# Patient Record
Sex: Male | Born: 1945 | Race: Black or African American | Hispanic: No | Marital: Married | Smoking: Former smoker
Health system: Southern US, Community
[De-identification: ages and names within clinical notes are randomized; demographics above are authoritative.]

## PROBLEM LIST (undated history)

## (undated) DIAGNOSIS — F4310 Post-traumatic stress disorder, unspecified: Secondary | ICD-10-CM

## (undated) DIAGNOSIS — I10 Essential (primary) hypertension: Secondary | ICD-10-CM

## (undated) DIAGNOSIS — R7303 Prediabetes: Secondary | ICD-10-CM

## (undated) DIAGNOSIS — K700 Alcoholic fatty liver: Secondary | ICD-10-CM

## (undated) DIAGNOSIS — K219 Gastro-esophageal reflux disease without esophagitis: Secondary | ICD-10-CM

## (undated) DIAGNOSIS — N289 Disorder of kidney and ureter, unspecified: Secondary | ICD-10-CM

## (undated) DIAGNOSIS — I429 Cardiomyopathy, unspecified: Secondary | ICD-10-CM

## (undated) DIAGNOSIS — F419 Anxiety disorder, unspecified: Secondary | ICD-10-CM

## (undated) DIAGNOSIS — F1020 Alcohol dependence, uncomplicated: Secondary | ICD-10-CM

## (undated) DIAGNOSIS — G4730 Sleep apnea, unspecified: Secondary | ICD-10-CM

## (undated) DIAGNOSIS — I519 Heart disease, unspecified: Secondary | ICD-10-CM

## (undated) HISTORY — DX: Alcoholic fatty liver: K70.0

## (undated) HISTORY — DX: Sleep apnea, unspecified: G47.30

## (undated) HISTORY — DX: Prediabetes: R73.03

## (undated) HISTORY — DX: Post-traumatic stress disorder, unspecified: F43.10

## (undated) HISTORY — DX: Alcohol dependence, uncomplicated: F10.20

## (undated) HISTORY — DX: Cardiomyopathy, unspecified: I42.9

## (undated) HISTORY — DX: Anxiety disorder, unspecified: F41.9

---

## 1999-03-10 ENCOUNTER — Emergency Department (HOSPITAL_COMMUNITY): Admission: EM | Admit: 1999-03-10 | Discharge: 1999-03-10 | Payer: Self-pay

## 2004-01-04 ENCOUNTER — Emergency Department (HOSPITAL_COMMUNITY): Admission: EM | Admit: 2004-01-04 | Discharge: 2004-01-04 | Payer: Self-pay | Admitting: Emergency Medicine

## 2004-03-29 ENCOUNTER — Ambulatory Visit: Payer: Self-pay | Admitting: Internal Medicine

## 2004-04-16 ENCOUNTER — Emergency Department (HOSPITAL_COMMUNITY): Admission: EM | Admit: 2004-04-16 | Discharge: 2004-04-17 | Payer: Self-pay | Admitting: Emergency Medicine

## 2008-02-18 ENCOUNTER — Ambulatory Visit (HOSPITAL_BASED_OUTPATIENT_CLINIC_OR_DEPARTMENT_OTHER): Admission: RE | Admit: 2008-02-18 | Discharge: 2008-02-18 | Payer: Self-pay | Admitting: Psychiatry

## 2008-02-26 ENCOUNTER — Ambulatory Visit: Payer: Self-pay | Admitting: Internal Medicine

## 2008-09-28 ENCOUNTER — Inpatient Hospital Stay (HOSPITAL_COMMUNITY): Admission: EM | Admit: 2008-09-28 | Discharge: 2008-10-03 | Payer: Self-pay | Admitting: Emergency Medicine

## 2008-09-28 IMAGING — CR DG CHEST 1V PORT
1 series · 1 of 1 positions shown · non-contrast
Comparison: None

CLINICAL DATA: Shortness of breath.

PORTABLE CHEST - 1 VIEW

[view not recorded]
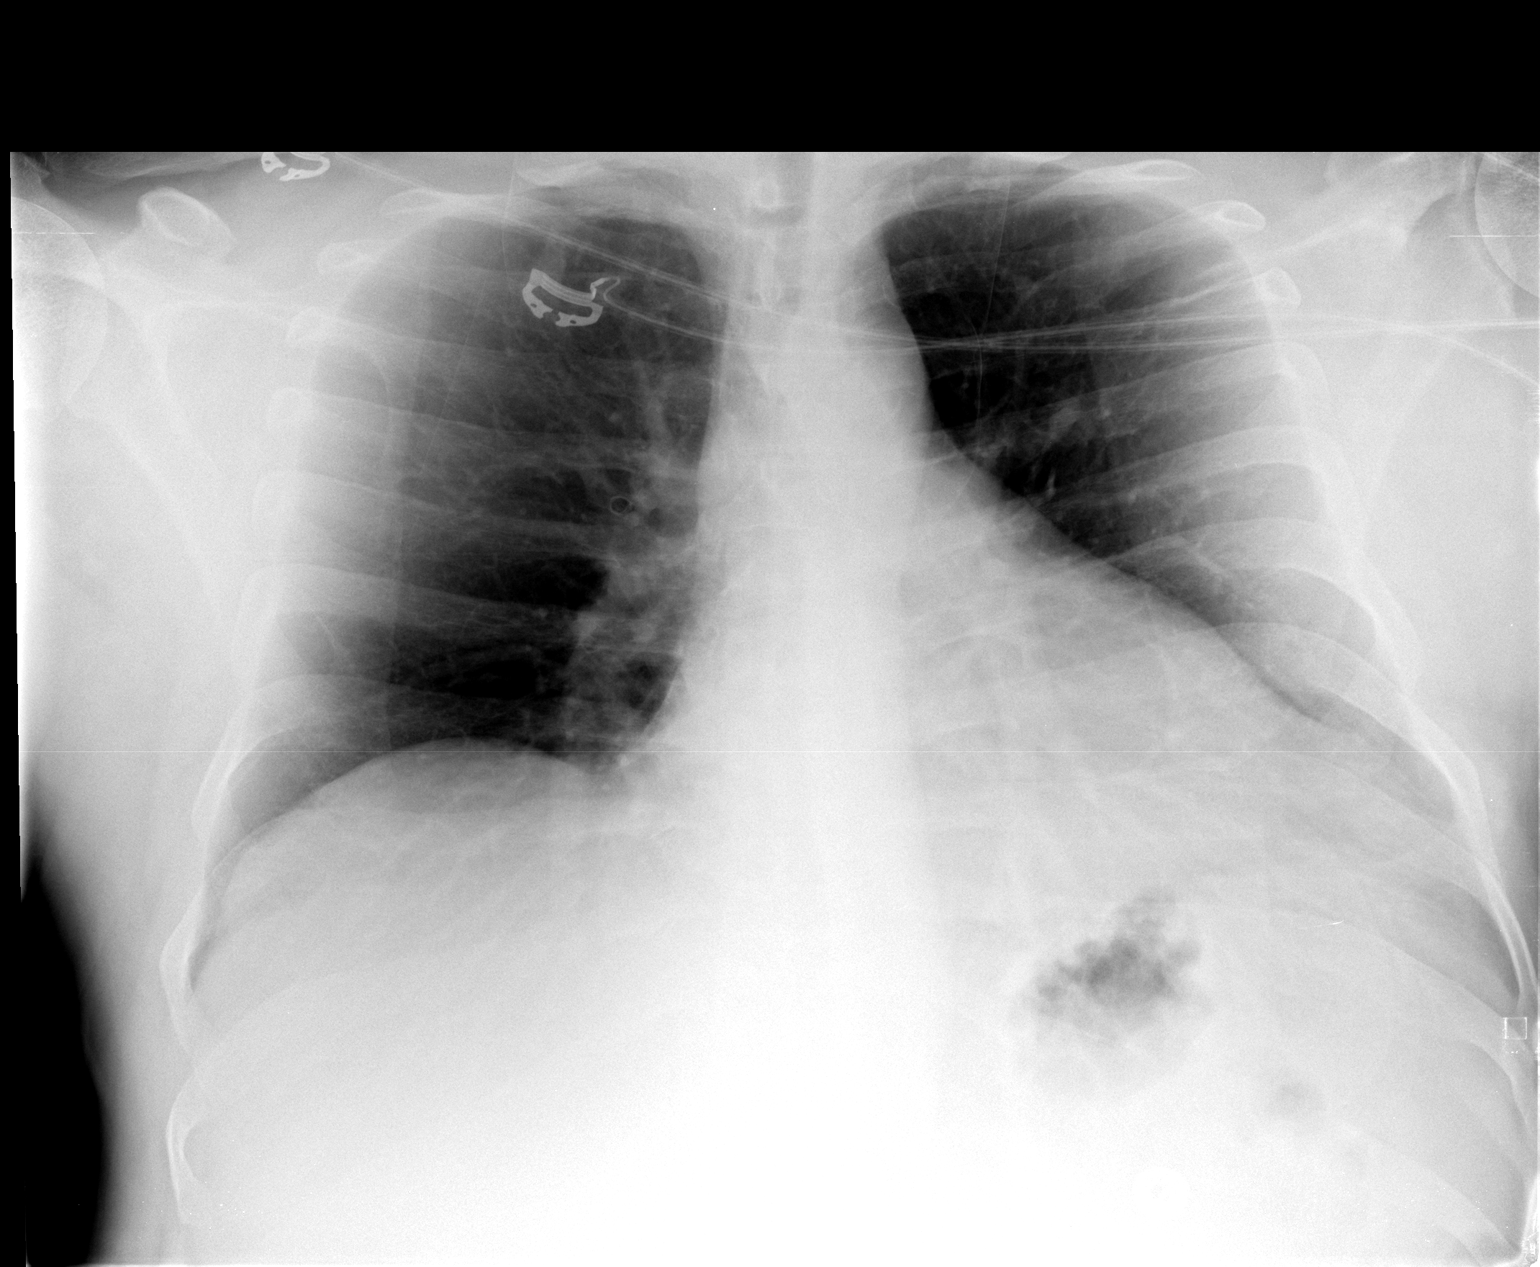

[1 of 1 positions shown; findings below may reference images not displayed]

FINDINGS: There is cardiomegaly.  Low lung volumes.  No focal
opacities or effusions.  No acute bony abnormality. AP lordotic
nature of the study accentuates heart size and basilar markings.
IMPRESSION: Cardiomegaly.  No acute findings.

## 2008-09-28 IMAGING — NM NM PULM PERFUSION & VENT (REBREATHING & WASHOUT)
2 series · 12 of 12 positions shown · non-contrast
Comparison: none

CLINICAL DATA: Short of breath.  Unstable angina.  Evaluate for
PE.  Elevated creatinine.

Exam:NM pulmonary perfusion and ventilation scan.
Radiopharmaceuticals:  9.5 mCi [LE] and 6.6 mCi technetium MAA
IV
Findings.  No perfusion or ventilation defects.  No well-defined
segmental or lobar perfusion defects.

[vq scan · 2.52mm/px · 6 of 20 frames shown (1 of 2)]
[frame 2/20  full-range]
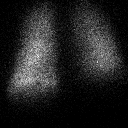
[frame 5/20  full-range]
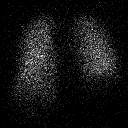
[frame 9/20]
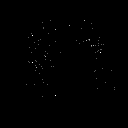
[frame 12/20]
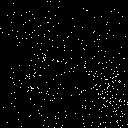
[frame 15/20]
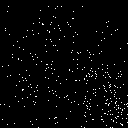
[frame 19/20  full-range]
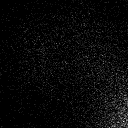

[vq scan · 2.52mm/px · 6 of 20 frames shown (2 of 2)]
[frame 2/20  full-range]
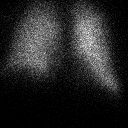
[frame 5/20  full-range]
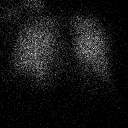
[frame 9/20  full-range]
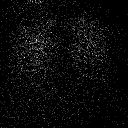
[frame 12/20]
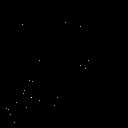
[frame 15/20]
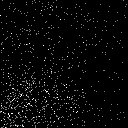
[frame 19/20  full-range]
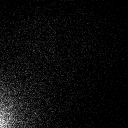

[12 of 12 positions shown; findings below may reference images not displayed]

IMPRESSION: Negative for PE.  Probability for PE is low.

## 2008-09-29 ENCOUNTER — Encounter (INDEPENDENT_AMBULATORY_CARE_PROVIDER_SITE_OTHER): Payer: Self-pay | Admitting: Cardiology

## 2008-10-01 IMAGING — US US ABDOMEN COMPLETE
1 series · 14 of 25 positions shown · non-contrast
Comparison: None

CLINICAL DATA: Occasional abdominal pain

ABDOMINAL ULTRASOUND COMPLETE

[Series 1: us abdomen complete · 0.33mm/px · 14 of 70 slices shown]
[im 1/70]
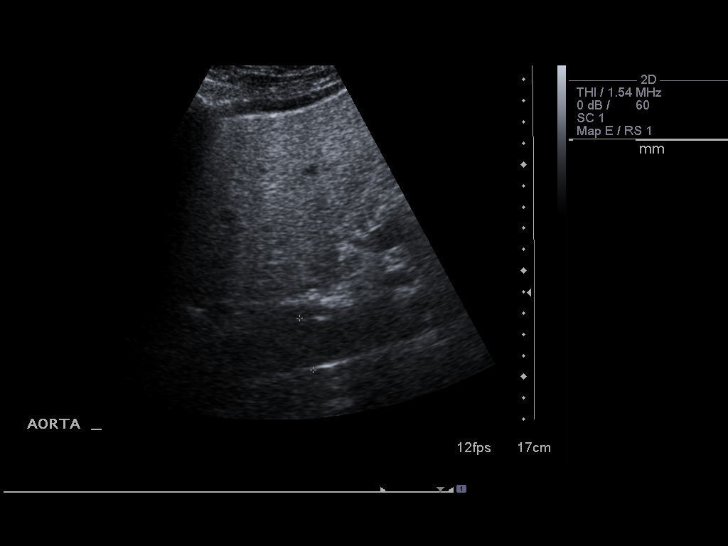
[im 6/70]
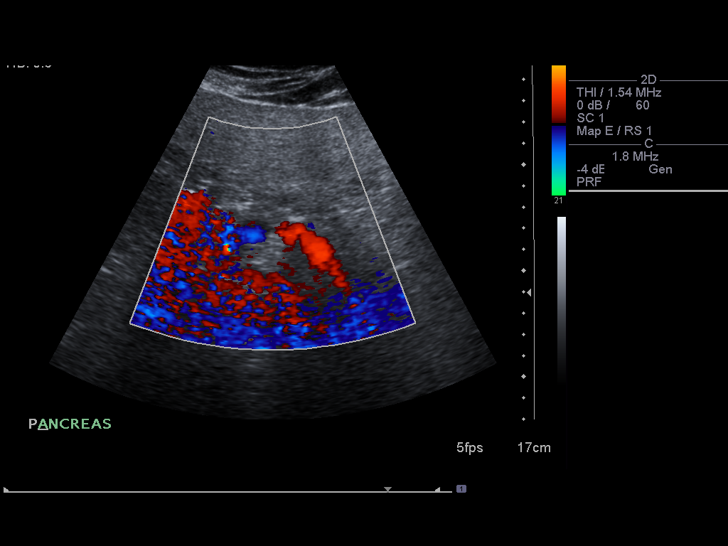
[im 12/70]
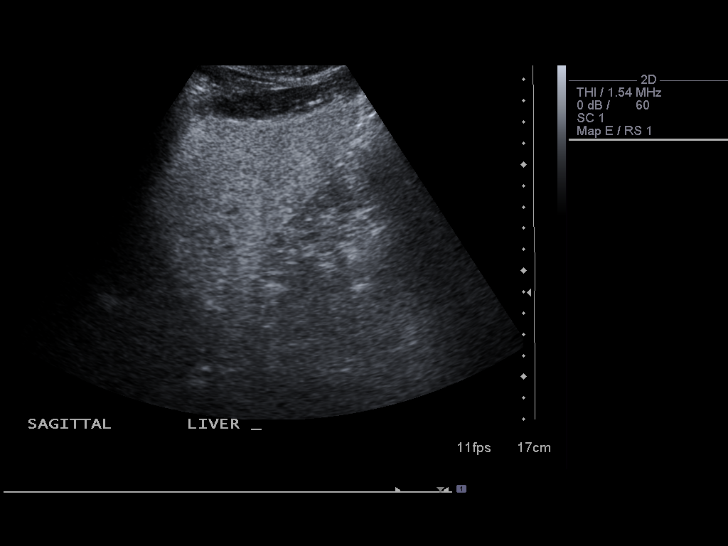
[im 18/70]
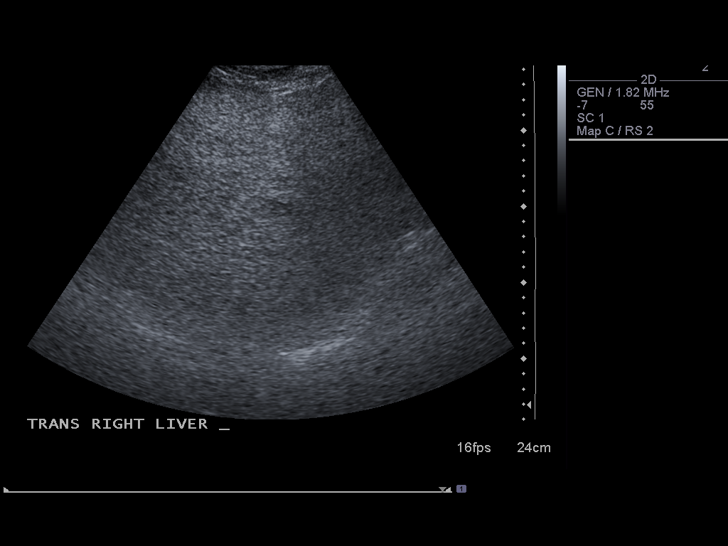
[im 24/70]
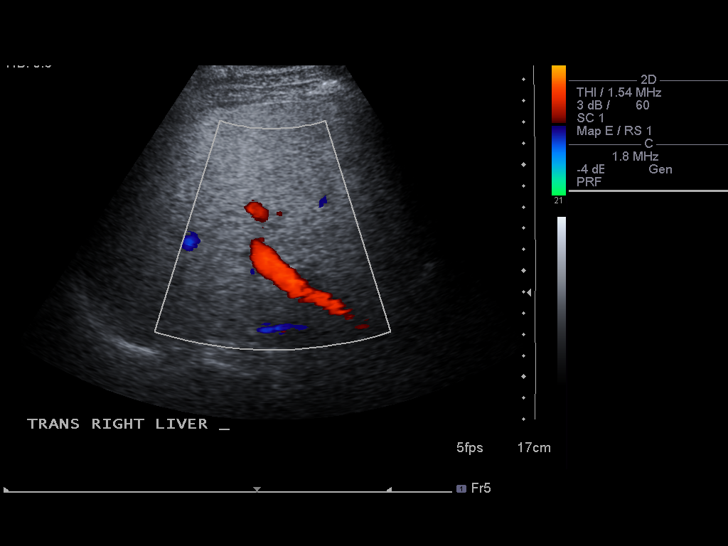
[im 26/70]
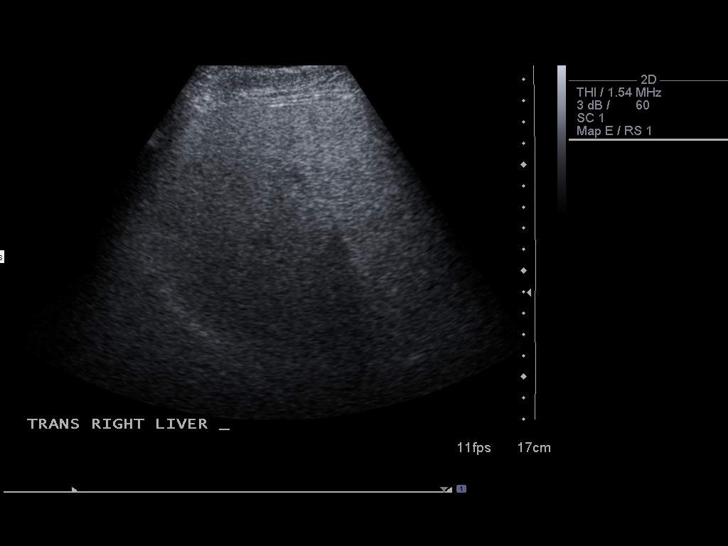
[im 32/70]
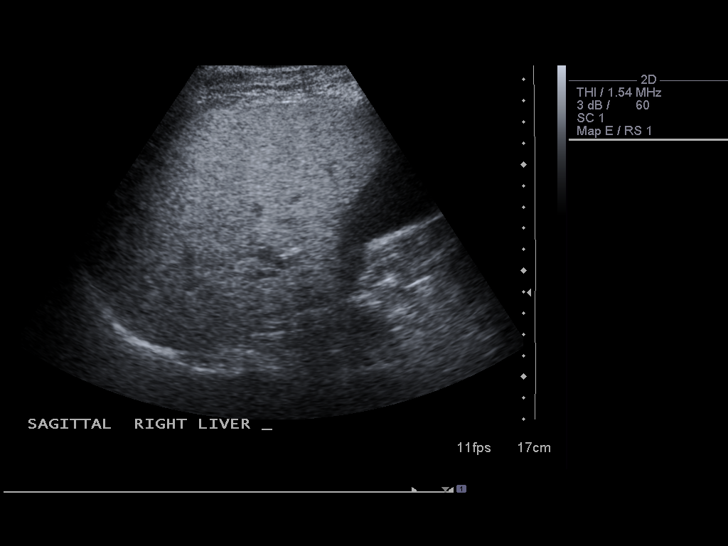
[im 38/70]
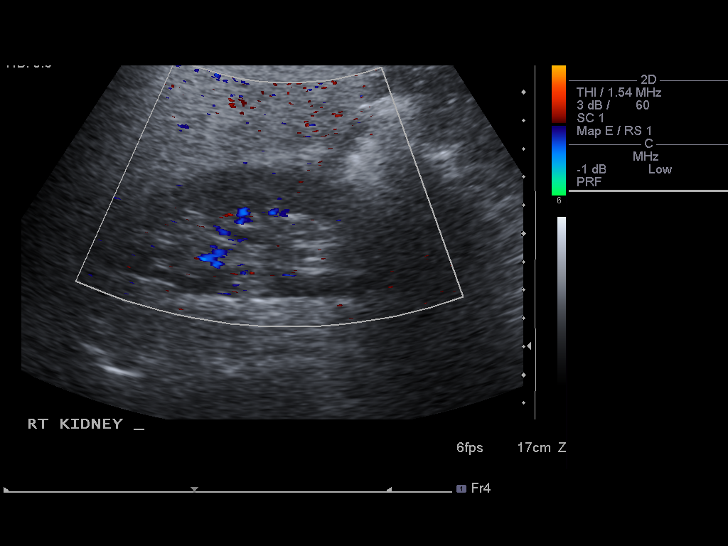
[im 44/70]
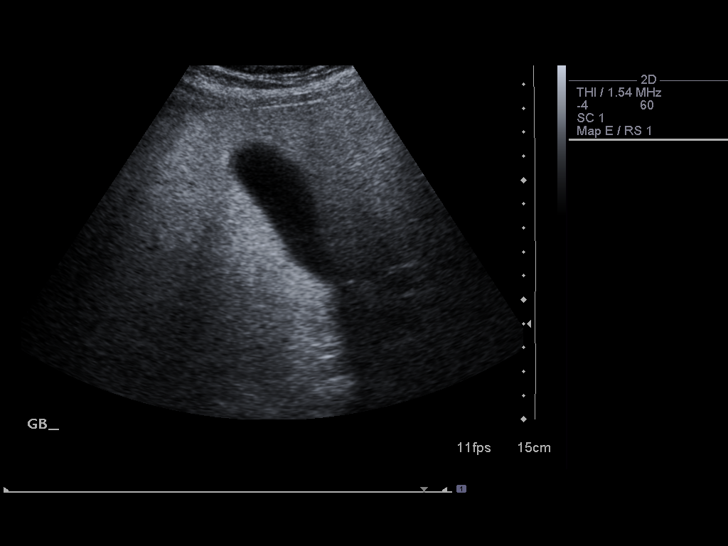
[im 47/70]
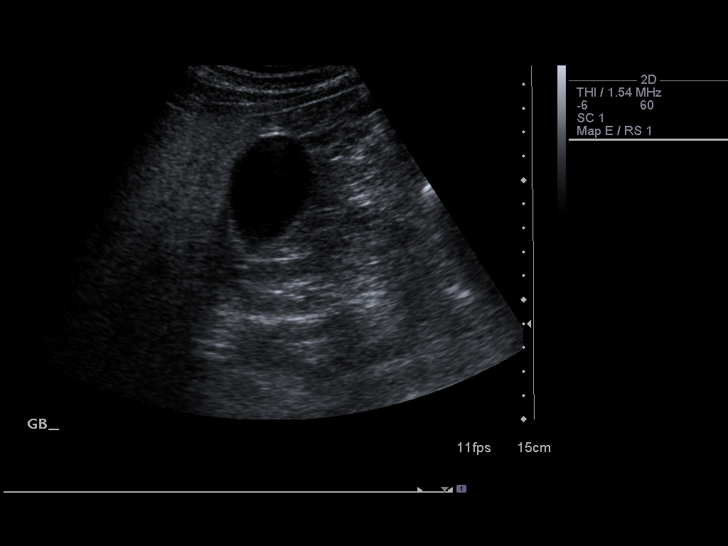
[im 52/70]
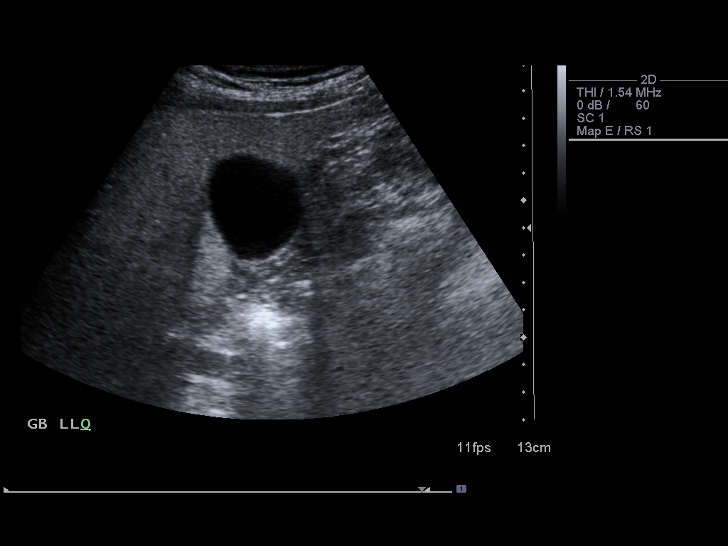
[im 58/70]
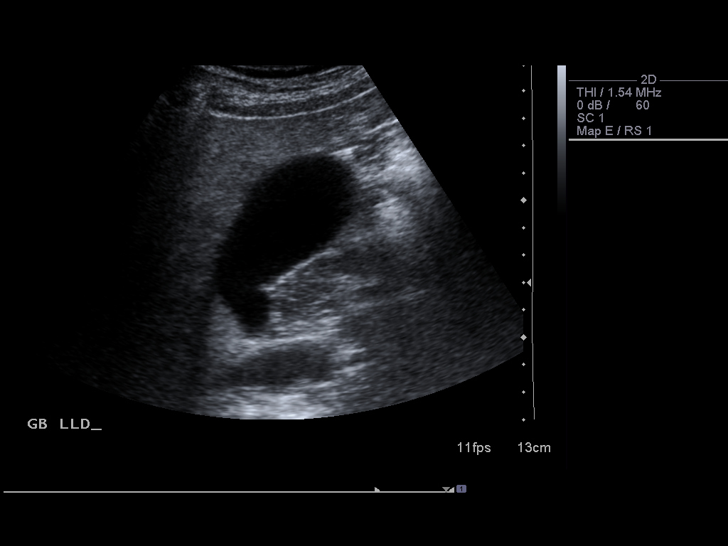
[im 64/70]
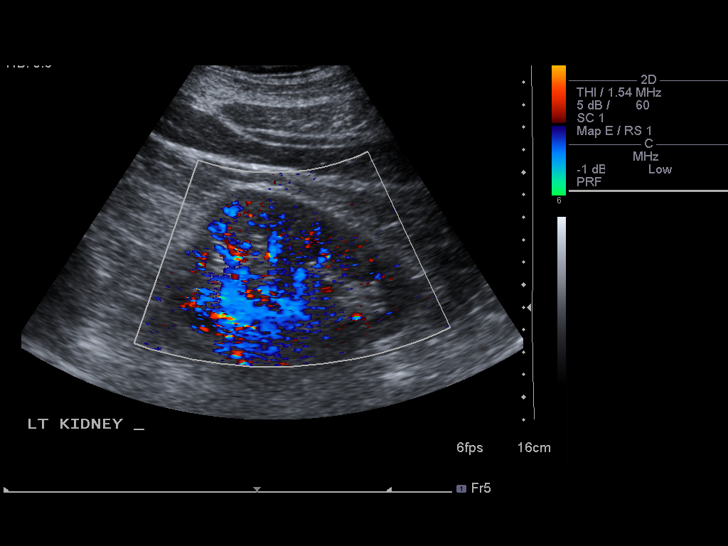
[im 70/70]
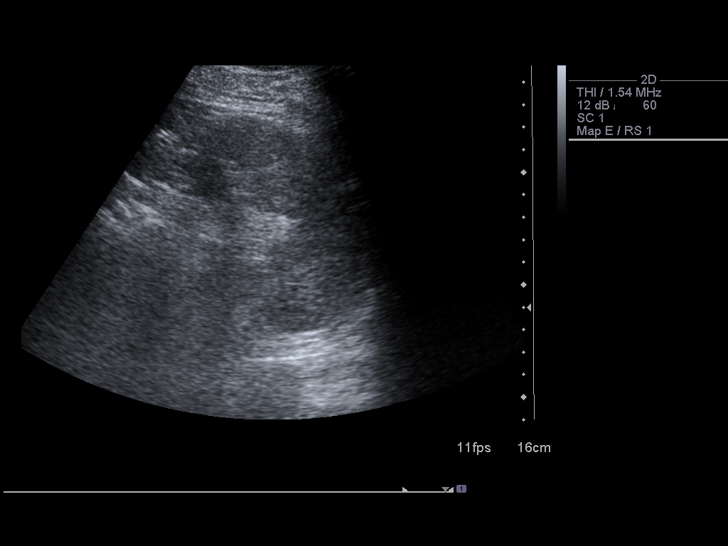

[14 of 25 positions shown; findings below may reference images not displayed]

FINDINGS: Gallbladder:  No gallstones, gallbladder wall thickening, or
pericholecystic fluid.

Common Bile Duct:  Within normal limits in caliber.

Liver:  No focal lesion identified.  Increased echogenicity most
consistent with diffuse fatty infiltration.

IVC:  Appears normal.

Pancreas:  Although the pancreas is difficult to visualize in its
entirety, no focal pancreatic abnormality is identified.

Spleen:  Within normal limits in size and echotexture.

Right kidney: Normal in size and parenchymal echogenicity.  No
evidence of mass or hydronephrosis.

Left kidney: Normal in size and parenchymal echogenicity.  No
evidence of mass or hydronephrosis.

Abdominal Aorta:  No aneurysm identified.
IMPRESSION: Fatty infiltration of the liver.

## 2009-05-24 ENCOUNTER — Emergency Department (HOSPITAL_COMMUNITY): Admission: EM | Admit: 2009-05-24 | Discharge: 2009-05-24 | Payer: Self-pay | Admitting: Emergency Medicine

## 2009-05-24 IMAGING — CR DG CHEST 1V PORT
1 series · 1 of 1 positions shown · non-contrast
Comparison: [DATE]

CLINICAL DATA: Shortness of breath

PORTABLE CHEST - 1 VIEW

[view not recorded]
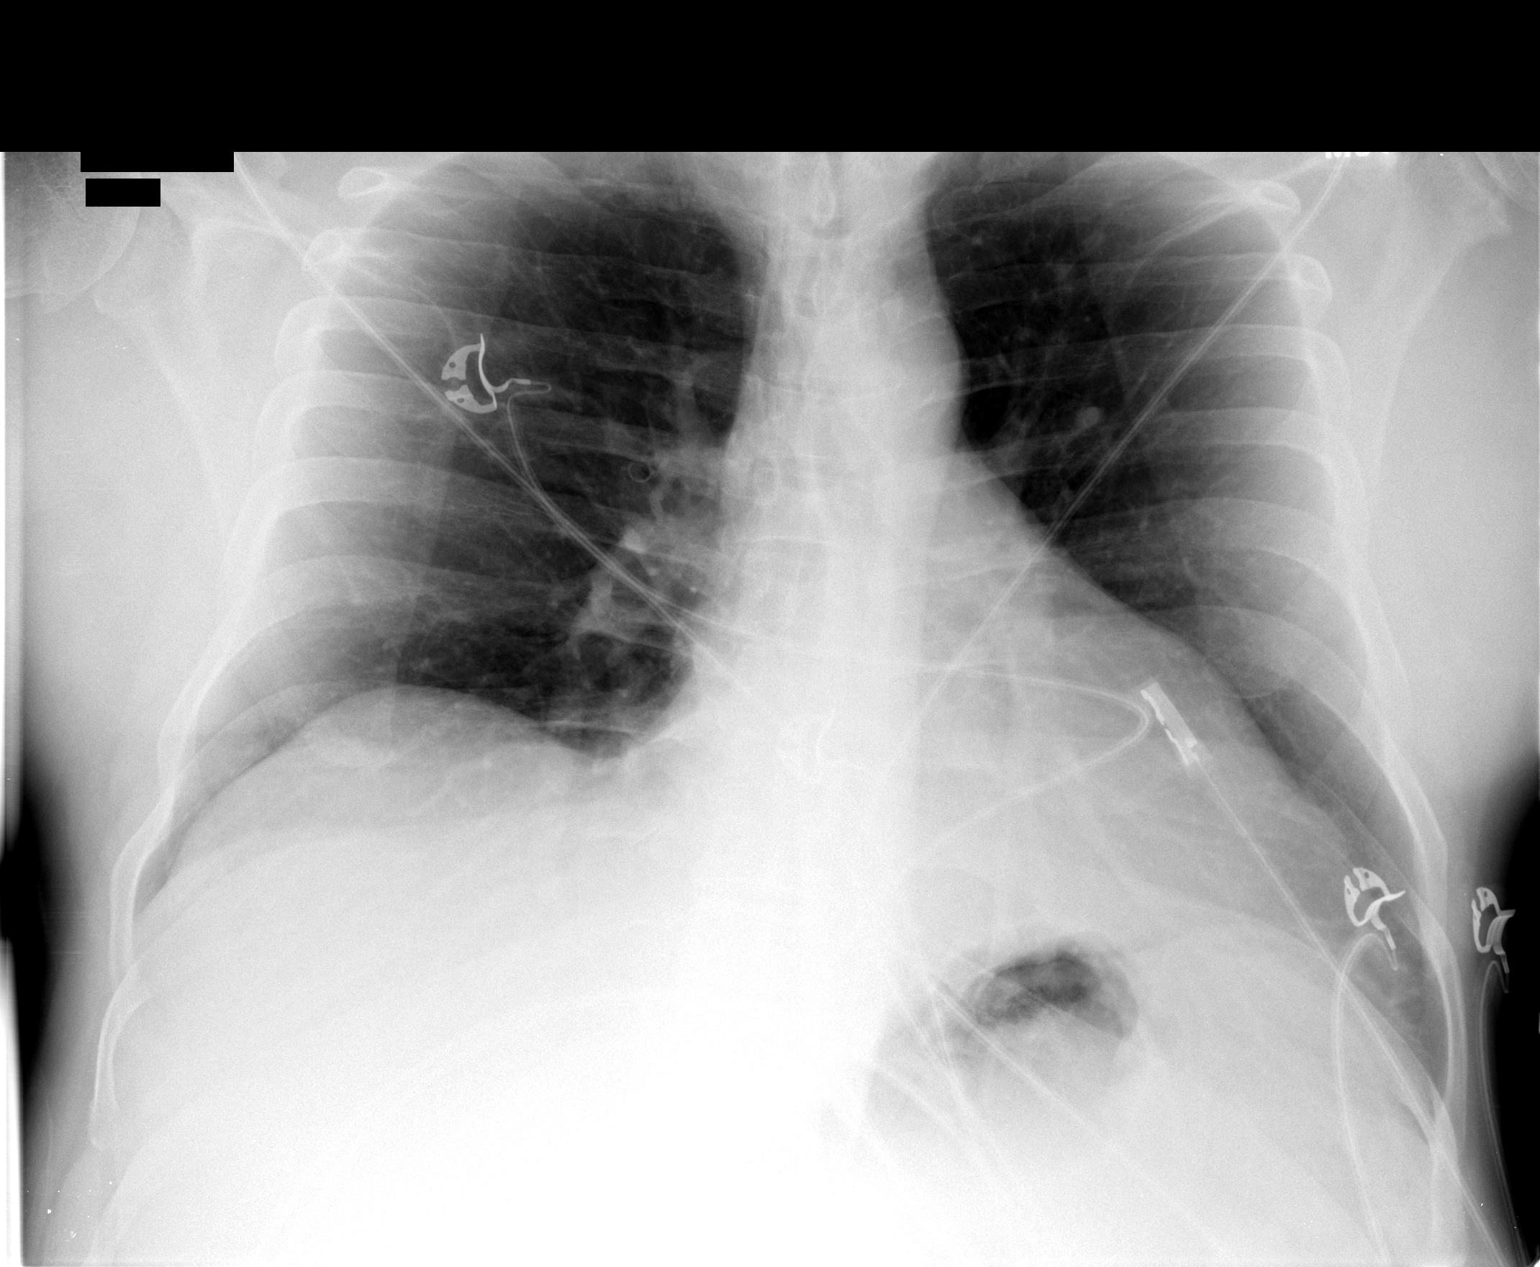

[1 of 1 positions shown; findings below may reference images not displayed]

FINDINGS: Heart size is accentuated by the AP lordotic positioning
of the study.  Suspect mild cardiomegaly.  Lungs are clear.  No
effusions or acute bony.
IMPRESSION: No active disease or change.

## 2009-06-27 ENCOUNTER — Emergency Department (HOSPITAL_COMMUNITY): Admission: EM | Admit: 2009-06-27 | Discharge: 2009-06-27 | Payer: Self-pay | Admitting: Emergency Medicine

## 2009-06-27 IMAGING — CR DG CHEST 1V PORT
1 series · 1 of 1 positions shown · non-contrast
Comparison: Plain films of the chest [DATE] and [DATE].

CLINICAL DATA: Shortness of breath.

PORTABLE CHEST - 1 VIEW

[view not recorded]
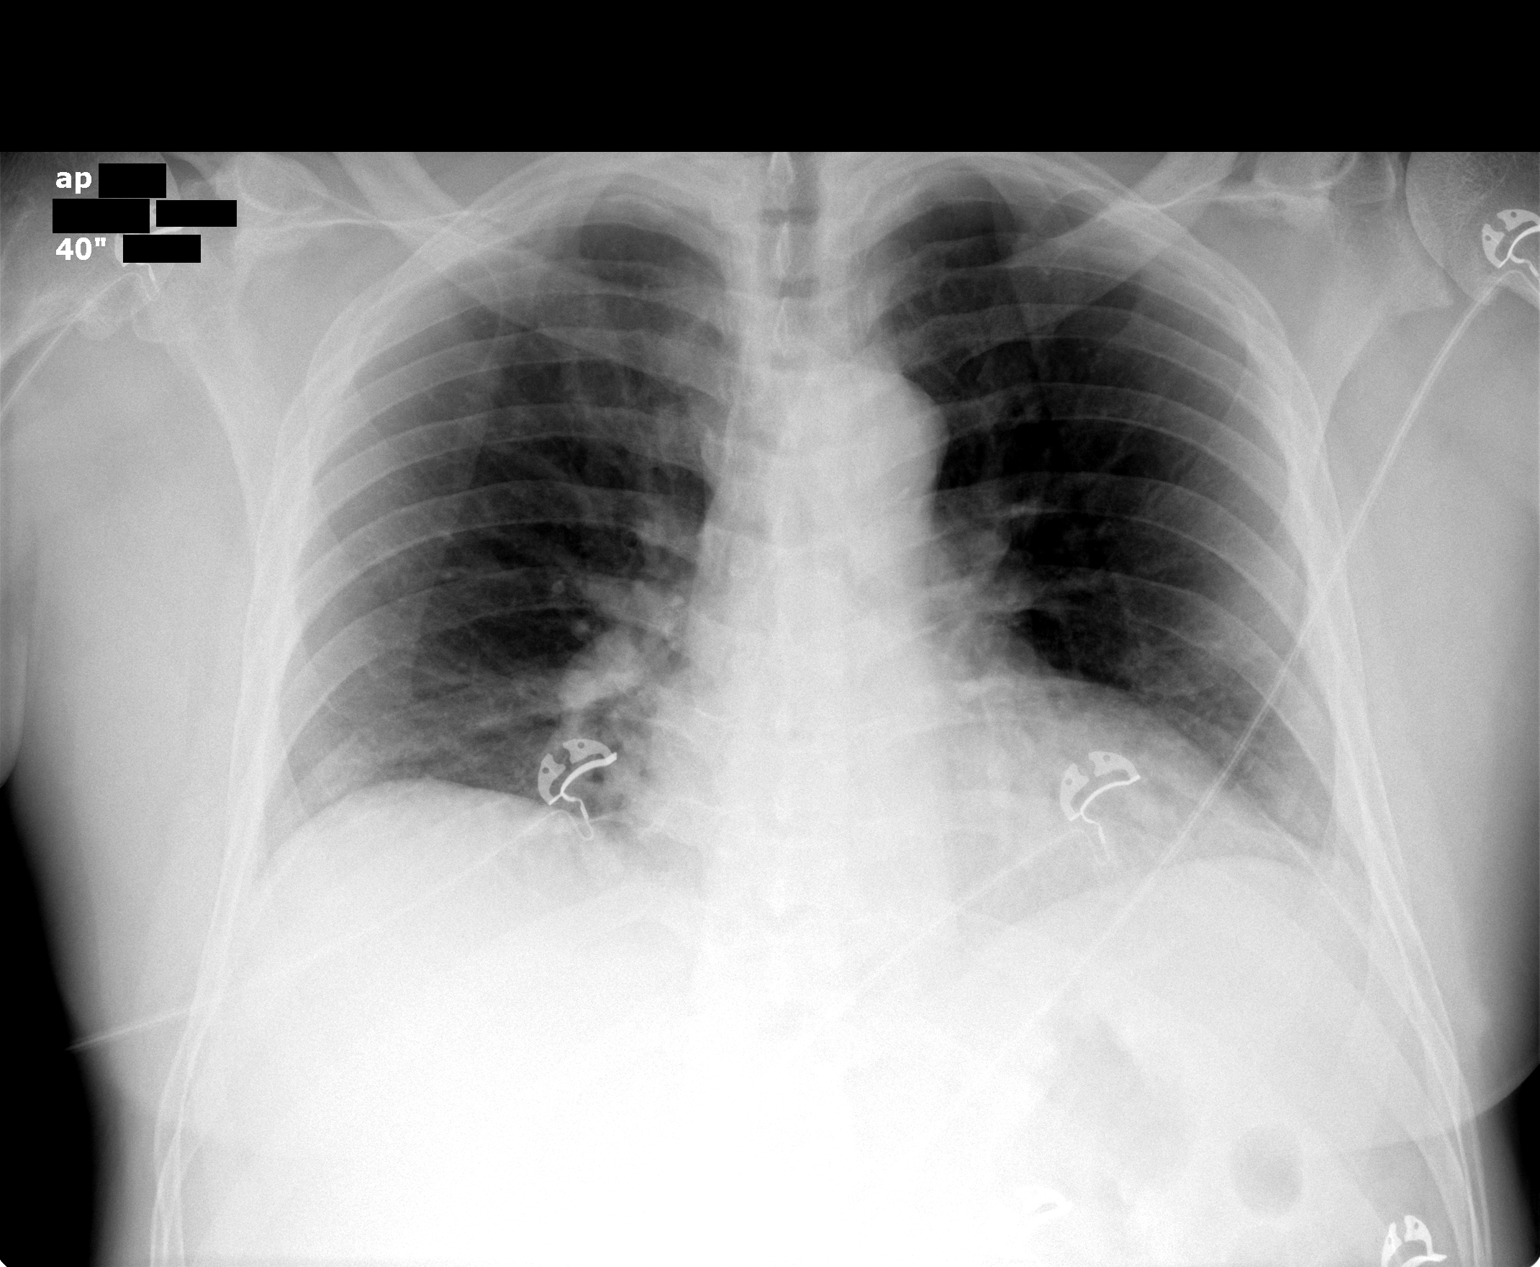

[1 of 1 positions shown; findings below may reference images not displayed]

FINDINGS: Lungs are clear.  Mild cardiomegaly without edema noted.
No effusion.
IMPRESSION: Mild cardiomegaly without acute disease.

## 2009-07-21 ENCOUNTER — Emergency Department (HOSPITAL_COMMUNITY)
Admission: EM | Admit: 2009-07-21 | Discharge: 2009-07-21 | Payer: Self-pay | Source: Home / Self Care | Admitting: Emergency Medicine

## 2010-06-12 LAB — POCT CARDIAC MARKERS
CKMB, poc: 3 ng/mL (ref 1.0–8.0)
Myoglobin, poc: 64.5 ng/mL (ref 12–200)
Troponin i, poc: <0.05 ng/mL (ref 0.00–0.09)

## 2010-06-12 LAB — DIFFERENTIAL
Eosinophils Absolute: 0.1 10*3/uL (ref 0.0–0.7)
Eosinophils Relative: 1 % (ref 0–5)
Lymphocytes Relative: 32 % (ref 12–46)
Lymphs Abs: 2.4 10*3/uL (ref 0.7–4.0)
Monocytes Absolute: 0.5 10*3/uL (ref 0.1–1.0)

## 2010-06-12 LAB — CBC
HCT: 43.1 % (ref 39.0–52.0)
Hemoglobin: 14.9 g/dL (ref 13.0–17.0)
MCV: 99.9 fL (ref 78.0–100.0)
WBC: 7.4 10*3/uL (ref 4.0–10.5)

## 2010-06-12 LAB — URINALYSIS, ROUTINE W REFLEX MICROSCOPIC
Bilirubin Urine: NEGATIVE
Glucose, UA: NEGATIVE mg/dL
Hgb urine dipstick: NEGATIVE
Protein, ur: NEGATIVE mg/dL
Urobilinogen, UA: 0.2 mg/dL (ref 0.0–1.0)

## 2010-06-12 LAB — POCT I-STAT, CHEM 8
BUN: 8 mg/dL (ref 6–23)
Calcium, Ion: 1.06 mmol/L — ABNORMAL LOW (ref 1.12–1.32)
Creatinine, Ser: 0.8 mg/dL (ref 0.4–1.5)
Glucose, Bld: 133 mg/dL — ABNORMAL HIGH (ref 70–99)
Hemoglobin: 15.3 g/dL (ref 13.0–17.0)
TCO2: 19 mmol/L (ref 0–100)

## 2010-06-12 LAB — BRAIN NATRIURETIC PEPTIDE: Pro B Natriuretic peptide (BNP): 78 pg/mL (ref 0.0–100.0)

## 2010-06-13 ENCOUNTER — Emergency Department (HOSPITAL_COMMUNITY)
Admission: EM | Admit: 2010-06-13 | Discharge: 2010-06-13 | Disposition: A | Discharge status: Home / Self Care | Payer: No Typology Code available for payment source | Attending: Emergency Medicine | Admitting: Emergency Medicine

## 2010-06-13 ENCOUNTER — Emergency Department (HOSPITAL_COMMUNITY): Payer: No Typology Code available for payment source

## 2010-06-13 DIAGNOSIS — M5030 Other cervical disc degeneration, unspecified cervical region: Secondary | ICD-10-CM | POA: Insufficient documentation

## 2010-06-13 DIAGNOSIS — T1490XA Injury, unspecified, initial encounter: Secondary | ICD-10-CM | POA: Insufficient documentation

## 2010-06-13 DIAGNOSIS — M542 Cervicalgia: Secondary | ICD-10-CM | POA: Insufficient documentation

## 2010-06-13 DIAGNOSIS — E119 Type 2 diabetes mellitus without complications: Secondary | ICD-10-CM | POA: Insufficient documentation

## 2010-06-13 DIAGNOSIS — R51 Headache: Secondary | ICD-10-CM | POA: Insufficient documentation

## 2010-06-13 DIAGNOSIS — M47812 Spondylosis without myelopathy or radiculopathy, cervical region: Secondary | ICD-10-CM | POA: Insufficient documentation

## 2010-06-13 DIAGNOSIS — Y92410 Unspecified street and highway as the place of occurrence of the external cause: Secondary | ICD-10-CM | POA: Insufficient documentation

## 2010-06-13 DIAGNOSIS — I10 Essential (primary) hypertension: Secondary | ICD-10-CM | POA: Insufficient documentation
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
## 2010-06-17 LAB — COMPREHENSIVE METABOLIC PANEL
ALT: 38 U/L (ref 0–53)
AST: 48 U/L — ABNORMAL HIGH (ref 0–37)
Albumin: 3.8 g/dL (ref 3.5–5.2)
Chloride: 106 mEq/L (ref 96–112)
Creatinine, Ser: 1.02 mg/dL (ref 0.4–1.5)
GFR calc Af Amer: >60 mL/min (ref >60)
Potassium: 3.9 mEq/L (ref 3.5–5.1)
Sodium: 143 mEq/L (ref 135–145)
Total Bilirubin: 1.9 mg/dL — ABNORMAL HIGH (ref 0.3–1.2)

## 2010-06-17 LAB — CBC
MCV: 98.6 fL (ref 78.0–100.0)
Platelets: 188 10*3/uL (ref 150–400)
WBC: 7.7 10*3/uL (ref 4.0–10.5)

## 2010-06-17 LAB — DIFFERENTIAL
Basophils Absolute: 0 10*3/uL (ref 0.0–0.1)
Eosinophils Relative: 0 % (ref 0–5)
Lymphocytes Relative: 28 % (ref 12–46)
Monocytes Absolute: 0.5 10*3/uL (ref 0.1–1.0)

## 2010-06-17 LAB — POCT CARDIAC MARKERS
CKMB, poc: 3 ng/mL (ref 1.0–8.0)
Myoglobin, poc: 96.3 ng/mL (ref 12–200)

## 2010-06-17 LAB — BRAIN NATRIURETIC PEPTIDE: Pro B Natriuretic peptide (BNP): 42 pg/mL (ref 0.0–100.0)

## 2010-06-17 LAB — APTT: aPTT: 23 seconds — ABNORMAL LOW (ref 24–37)

## 2010-06-30 LAB — CBC
HCT: 39.5 % (ref 39.0–52.0)
Hemoglobin: 13.9 g/dL (ref 13.0–17.0)
Hemoglobin: 14.8 g/dL (ref 13.0–17.0)
MCHC: 34.9 g/dL (ref 30.0–36.0)
MCHC: 35 g/dL (ref 30.0–36.0)
MCHC: 35.2 g/dL (ref 30.0–36.0)
MCHC: 35.2 g/dL (ref 30.0–36.0)
MCV: 97 fL (ref 78.0–100.0)
MCV: 98.3 fL (ref 78.0–100.0)
Platelets: 219 10*3/uL (ref 150–400)
RBC: 3.99 MIL/uL — ABNORMAL LOW (ref 4.22–5.81)
RBC: 4.29 MIL/uL (ref 4.22–5.81)
RDW: 12.8 % (ref 11.5–15.5)
WBC: 5.6 10*3/uL (ref 4.0–10.5)
WBC: 9.3 10*3/uL (ref 4.0–10.5)

## 2010-06-30 LAB — BASIC METABOLIC PANEL
CO2: 22 mEq/L (ref 19–32)
CO2: 24 mEq/L (ref 19–32)
CO2: 26 mEq/L (ref 19–32)
Calcium: 8.7 mg/dL (ref 8.4–10.5)
Calcium: 8.7 mg/dL (ref 8.4–10.5)
Chloride: 107 mEq/L (ref 96–112)
Creatinine, Ser: 1.04 mg/dL (ref 0.4–1.5)
GFR calc Af Amer: >60 mL/min (ref >60)
GFR calc non Af Amer: >60 mL/min (ref >60)
Glucose, Bld: 114 mg/dL — ABNORMAL HIGH (ref 70–99)
Glucose, Bld: 197 mg/dL — ABNORMAL HIGH (ref 70–99)
Potassium: 3.6 mEq/L (ref 3.5–5.1)
Sodium: 132 mEq/L — ABNORMAL LOW (ref 135–145)
Sodium: 137 mEq/L (ref 135–145)

## 2010-06-30 LAB — TROPONIN I: Troponin I: 0.03 ng/mL (ref 0.00–0.06)

## 2010-06-30 LAB — COMPREHENSIVE METABOLIC PANEL
ALT: 53 U/L (ref 0–53)
AST: 63 U/L — ABNORMAL HIGH (ref 0–37)
Alkaline Phosphatase: 72 U/L (ref 39–117)
BUN: 9 mg/dL (ref 6–23)
CO2: 20 mEq/L (ref 19–32)
CO2: 26 mEq/L (ref 19–32)
Calcium: 9.4 mg/dL (ref 8.4–10.5)
Chloride: 102 mEq/L (ref 96–112)
Creatinine, Ser: 0.9 mg/dL (ref 0.4–1.5)
Creatinine, Ser: 3.25 mg/dL — ABNORMAL HIGH (ref 0.4–1.5)
GFR calc Af Amer: 23 mL/min — ABNORMAL LOW (ref >60)
GFR calc non Af Amer: 19 mL/min — ABNORMAL LOW (ref >60)
GFR calc non Af Amer: >60 mL/min (ref >60)
Glucose, Bld: 121 mg/dL — ABNORMAL HIGH (ref 70–99)
Potassium: 3.5 mEq/L (ref 3.5–5.1)
Sodium: 139 mEq/L (ref 135–145)
Total Bilirubin: 1.2 mg/dL (ref 0.3–1.2)
Total Protein: 8.1 g/dL (ref 6.0–8.3)

## 2010-06-30 LAB — POCT I-STAT, CHEM 8
BUN: 18 mg/dL (ref 6–23)
Chloride: 110 mEq/L (ref 96–112)
HCT: 48 % (ref 39.0–52.0)
Potassium: 4.5 mEq/L (ref 3.5–5.1)
Sodium: 142 mEq/L (ref 135–145)

## 2010-06-30 LAB — BILIRUBIN, FRACTIONATED(TOT/DIR/INDIR)
Bilirubin, Direct: 0.2 mg/dL (ref 0.0–0.3)
Indirect Bilirubin: 0.8 mg/dL (ref 0.3–0.9)
Indirect Bilirubin: 1.1 mg/dL — ABNORMAL HIGH (ref 0.3–0.9)
Total Bilirubin: 1.3 mg/dL — ABNORMAL HIGH (ref 0.3–1.2)

## 2010-06-30 LAB — GLUCOSE, CAPILLARY
Glucose-Capillary: 104 mg/dL — ABNORMAL HIGH (ref 70–99)
Glucose-Capillary: 109 mg/dL — ABNORMAL HIGH (ref 70–99)
Glucose-Capillary: 109 mg/dL — ABNORMAL HIGH (ref 70–99)
Glucose-Capillary: 113 mg/dL — ABNORMAL HIGH (ref 70–99)
Glucose-Capillary: 124 mg/dL — ABNORMAL HIGH (ref 70–99)
Glucose-Capillary: 129 mg/dL — ABNORMAL HIGH (ref 70–99)
Glucose-Capillary: 130 mg/dL — ABNORMAL HIGH (ref 70–99)
Glucose-Capillary: 134 mg/dL — ABNORMAL HIGH (ref 70–99)
Glucose-Capillary: 150 mg/dL — ABNORMAL HIGH (ref 70–99)
Glucose-Capillary: 154 mg/dL — ABNORMAL HIGH (ref 70–99)
Glucose-Capillary: 185 mg/dL — ABNORMAL HIGH (ref 70–99)

## 2010-06-30 LAB — URINALYSIS, ROUTINE W REFLEX MICROSCOPIC
Hgb urine dipstick: NEGATIVE
Nitrite: NEGATIVE
Protein, ur: NEGATIVE mg/dL
Urobilinogen, UA: 1 mg/dL (ref 0.0–1.0)

## 2010-06-30 LAB — T4, FREE: Free T4: 0.81 ng/dL (ref 0.80–1.80)

## 2010-06-30 LAB — CARDIAC PANEL(CRET KIN+CKTOT+MB+TROPI)
CK, MB: 2.6 ng/mL (ref 0.3–4.0)
Relative Index: 1.5 (ref 0.0–2.5)
Troponin I: 0.02 ng/mL (ref 0.00–0.06)

## 2010-06-30 LAB — POCT CARDIAC MARKERS
CKMB, poc: 2.1 ng/mL (ref 1.0–8.0)
Troponin i, poc: <0.05 ng/mL (ref 0.00–0.09)

## 2010-06-30 LAB — CK TOTAL AND CKMB (NOT AT ARMC)
CK, MB: 3.6 ng/mL (ref 0.3–4.0)
Total CK: 261 U/L — ABNORMAL HIGH (ref 7–232)

## 2010-06-30 LAB — APTT: aPTT: 26 seconds (ref 24–37)

## 2010-06-30 LAB — D-DIMER, QUANTITATIVE: D-Dimer, Quant: 0.8 ug/mL-FEU — ABNORMAL HIGH (ref 0.00–0.48)

## 2010-08-06 NOTE — Consult Note (Signed)
NAME:  Don Norman, Don Norman                ACCOUNT NO.:  [id number]   MEDICAL RECORD NO.:  [id number]           PATIENT TYPE:   LOCATION:                                 FACILITY:   PHYSICIAN:  Maree Krabbe, M.D.DATE OF BIRTH:  02-05-1946   DATE OF CONSULTATION:  09/29/2008  DATE OF DISCHARGE:                                 CONSULTATION   PRIMARY CARE [place]:  Dr. Marlowe Aschoff   REASON FOR CONSULTATION:  Elevated creatinine.   HISTORY:  This is a 65 year old African American male with a previous  history of diabetes of 1-2 years duration and hypertension.  He also has  gastroesophageal reflux, managed with medication.  He otherwise has been  healthy.  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  He is followed by the [facility].   The patient was working outside yesterday for 8 or 9 hours when he  became overheated, according to his description.  He had to lay down in  the shade and still felt like he could not cool off.  He then developed  acute onset of dyspnea.  He states he felt like he was dying.  He denies  any associated chest pain or tightness.  He stood up and bent over, and  the symptoms resolved, only to come back a couple of more times and he  was brought to the emergency room by a friend.  On presentation,  according to the patient he was pouring out sweat.  He had a  temperature of 100.3 and was given some ice packs in the axilla.  This  apparently improved his temperature.  He was seen by the Cardiology  service, given some nitroglycerin, oxygen, and his dyspnea has resolved  and has not returned.  Cardiac enzymes were negative, and chest x-ray  was normal without CHF.  BNP was normal.  D-dimer was 0.8, and a V/Q  scan was done which was negative.  However, his creatinine was 3.2 on  admission.  He denies any history of having kidney problems in the past.   I spoke with [facility] and his creatinine was 1.1 in  May  2010.  He is followed by Dr. Vonita Moss at the [facility] as his primary  care doctor.   The patient denies any history of kidney disease in the family.  He  denies any difficulty voiding recently, any history of prostate disease,  dysuria, incomplete voiding, or hematuria.  No history of kidney stones  either.   PAST MEDICAL HISTORY:  1. Diabetes, 1 year's duration, on metformin.  2. Hypertension, was taking lisinopril and a beta-blocker at home.  3. GERD.  4. History of elevated bilirubin in the past.   PAST SURGICAL HISTORY:  Noncontributory.   MEDICATIONS ON ADMISSION:  1. Lisinopril 40 mg daily.  2. Metoprolol 12.5 mg b.i.d.  3. Metformin 500 mg daily.  4. Omeprazole 20 b.i.d.   ALLERGIES:  None.   CURRENT MEDICATIONS:  Aspirin, nitroglycerin drip, Lovenox subcu, and  sliding scale insulin, normal saline at  20 mL/hour.   SOCIAL HISTORY:  The patient drinks about 340 ounces of beers a night  and one-shot of gin.  He denies any tobacco use or use of illegal  substances.  He works infrequently doing Aeronautical engineer work.   REVIEW OF SYSTEMS:  GENERAL:  Denies fever, chills, sweats, hearing  loss, visual change, sore throat, or difficulty swallowing.  He does  wear glasses.  CARDIORESPIRATORY:  As above.  Denies any history of  heart attack, congestive heart failure in the past.  No productive  cough.  GASTROINTESTINAL:  History of esophageal reflux as above.  He  has been managed well with medication.  He used to have symptoms of  gagging and dry heaves which resolved for the most part with taking a  proton pump inhibitor.  He denies any history of gastric ulcer disease,  abdominal pain, nausea, vomiting, or diarrhea recently.  GENITOURINARY:  As above.  MUSCULOSKELETAL:  Denies any history of  arthritis, gout, joint pain or swelling.  NEUROLOGIC:  Denies history of  stroke, TIA, seizure, focal numbness or weakness.  DERMATOLOGIC:  Denies  history of rash or psoriasis or  other skin problems.  ENDOCRINE:  Denies  any history of thyroid disease.  Diabetes as above.   PHYSICAL EXAMINATION:  VITAL SIGNS:  Temperature 98.4, blood pressure  126/80, pulse 80, respirations 18 and nonlabored.  GENERAL:  This is a pleasant adult male, in no distress.  SKIN:  Warm and dry without rash or cyanosis.  HEENT:  PERRL, EOMI.  Sclerae are muddy.  Throat is clear and moist.  NECK:  Supple with JVP of 8 cm.  No carotid bruits.  CHEST:  Clear throughout.  No rales, rhonchi, or wheezing.  CARDIAC:  Regular rate and rhythm without murmur, rub, or gallop.  No  lifts or heaves.  Quiet precordium.  ABDOMEN:  Soft, nontender, active bowel sounds, 8 cm liver to  percussion.  EXTREMITIES:  No femoral bruits.  Good pulses in the feet and at the  popliteal location bilaterally.  No ankle edema.  NEUROLOGIC:  Alert and oriented.  No gross focal deficits.   LABORATORY DATA:  Sodium 139, potassium 5.2, BUN 16, creatinine 3.25,  glucose 157.  Total bilirubin 2.2.  BNP 51.  White blood count 9000,  hemoglobin 15.  Chest x-ray negative.  CPK normal.  V/Q negative.   IMPRESSION:  1. Acute renal insufficiency due to volume depletion and hemodynamic      factors primarily, also angiotensin-converting enzyme inhibitor no      doubt contributing.  Doubt any chronic kidney disease since the      patient's creatinine was normal in May of this year.  We will check      urinalysis and renal ultrasound and increase fluids for now.  2. Diabetes with less than 5 years' duration.  3. Hypertension on angiotensin-converting enzyme inhibitor and beta-      blocker on admission.  No diuretics at home.  Currently, his blood      pressure meds are being held.  4. Dyspnea, acute onset; no congestive heart failure on exam or x-ray.      V/Q negative workup per primary.  5. Volume status; the patient describes what sounds like acute      exhaustion yesterday at work.  I suspect may have been hypovolemic       due to a combination of the heat exposure and his chronic alcohol      intake.  6.  Daily alcohol use.   RECOMMENDATIONS:  Continue to hold ACE inhibitor, avoid NSAIDs and  nephrotoxins, and increase IV fluids.  Urinalysis and ultrasound of the  kidneys.  We will follow up.  Expect recovery.      Maree Krabbe, M.D.  Electronically Signed     RDS/MEDQ  D:  09/29/2008  T:  09/30/2008  Job:  [id number]

## 2010-08-06 NOTE — Procedures (Signed)
NAME:  Don Norman, Don Norman                ACCOUNT NO.:  [id number]   MEDICAL RECORD NO.:  [id number]          PATIENT TYPE:  OUT   LOCATION:  SLEEP CENTER                 FACILITY:  [facility]   PHYSICIAN:  Clinton D. Maple Hudson, MD, FCCP, FACPDATE OF BIRTH:  January 07, 1946   DATE OF STUDY:  02/18/2008                            NOCTURNAL POLYSOMNOGRAM   REFERRING PHYSICIAN:   INDICATION FOR STUDY:  Hypersomnia with sleep apnea.   EPWORTH SLEEPINESS SCORE:  1/24.  BMI 27.1.  Weight 200 pounds.  Height  72 inches.  Neck 16.5 inches.   MEDICATIONS:  No home medications reported.   SLEEP ARCHITECTURE:  Total sleep time 299 minutes with sleep efficiency  68.7%.  Stage I was 6.4%.  Stage II 75.8%.  Stage III absent.  REM 17.9%  of total sleep time.  Sleep latency 57 minutes.  REM latency 112  minutes.  Awake after sleep onset 76 minutes.  Arousal index 4.8.  No  bedtime medication taken.   RESPIRATORY DATA:  Apnea-hypopnea index (AHI) 14.6 per hour.  A total of  73 events were scored including 2 obstructive apneas and 71 hypopneas.  Events were present in all positions, but more common while supine.  REM  AHI 26.9 per hour.  There were insufficient early events to permit CPAP  titration by split protocol on the study night.   OXYGEN DATA:  Moderately loud snoring with oxygen desaturation to a  nadir of 83%.  Mean oxygen saturation through the study 92.7% on room  air.   CARDIAC DATA:  Sinus rhythm with frequent PVCs and probable incomplete  bundle-branch block single lead recording.   MOVEMENT-PARASOMNIA:  Periodic limb movement.  A total of 73 limb jerks  was scored.  Three events were specifically associated with arousal or  awakening for periodic limb movement with arousal index of 0.6 per hour.  Review on monitor reveals sustained intervals of leg jerking and the  actual count that may go on to represent the amount of sleep disturbance  caused at home on some nights.  No rest room trips.   IMPRESSIONS-RECOMMENDATIONS:  1. Mild obstructive sleep apnea/hypopnea syndrome, AHI 14.6 per hour.      Events were present in all positions, but more common while supine      and in REM.  Moderately loud snoring with oxygen desaturation to a      nadir of 83%.  2. There are insufficient early events to meet protocol criteria for      initiation of CPAP titration by split protocol on the study night.      There were enough events to support a trial of CPAP      therapeutically.  Consider return for CPAP titration or evaluate      for alternative management of sleep apnea as indicated.      Encouragement to sleep off back and to lose weight may be      appropriate.  Periodic limb movement with arousal 0.6 per hour.      Visual review of the video tape suggests frequency of events may be      higher than recorded on some nights  at home.  If clinical history      supports this impression, consider a trial of specific therapy such      as Requip or Mirapex if clinically appropriate.      Clinton D. Maple Hudson, MD, [facility], FACP  Diplomate, Biomedical engineer of Sleep Medicine  Electronically Signed     CDY/MEDQ  D:  02/26/2008 10:08:54  T:  02/26/2008 23:01:40  Job:  [id number]

## 2010-08-06 NOTE — Cardiovascular Report (Signed)
NAME:  Don Norman, Don Norman                ACCOUNT NO.:  [id number]   MEDICAL RECORD NO.:  [id number]          PATIENT TYPE:  INP   LOCATION:  4712                         FACILITY:  [facility]   PHYSICIAN:  Jake Bathe, MD      DATE OF BIRTH:  12-23-1945   DATE OF PROCEDURE:  09/28/2008  DATE OF DISCHARGE:                            CARDIAC CATHETERIZATION   PROCEDURES:  1. Left heart catheterization.  2. Selective coronary angiography.  3. Left ventriculogram.   INDICATIONS:  A 65 year old male with markedly abnormal EKG with deep T-  wave inversions throughout the precordial leads as well as decreased LV  function with ejection fraction between 30% and 40%, newly discovered.  Upon arrival to hospital, he had a creatinine of 3.5.  He was hydrated  overnight and his creatinine returned to normal at 0.9.  It has been at  this level for the past 3 days.  An informed consent was obtained.  Risk  of stroke, heart attack, death, renal impairment, bleeding, and arterial  damage were explained to the patient at length.  He was placed on the  catheterization table, prepped in a sterile fashion.  Lidocaine 1% was  used for local anesthesia.  Femoral head was visualized via fluoroscopy.  Using the modified Seldinger technique, a 5-French catheter was placed  in the right femoral artery.  A Judkins left #4 catheter and a Judkins  right #4 catheter were used to selectively cannulate the coronary  arteries.  Multiple views of hand injection and Omnipaque were obtained.  An angle pigtail was used to cross into the left ventricle.  Hemodynamics were obtained.  In the RAO position, a left ventriculogram  utilizing 36 mL of contrast was obtained.  Pullback measured.  Following  the procedure, catheters were removed.  He was hemodynamically stable.  At the beginning of the procedure, his blood pressures were in the 180  systolic.  Hydralazine 10 mg was administered.   FINDINGS:  1. Left main artery -  branches into the circumflex, ramus, and left      anterior descending artery.  This is a left dominant system.  The      left anterior descending artery is a large vessel giving rise to 1      moderate-size diagonal branch, which has 20% ostial stenosis.  The      LAD then continues to wrap around the apex without any significant      disease.  The circumflex branch is large and functions as the      posterior descending artery.  There is minor 20% ostial stenosis.      There is also a large ramus branch encompassing much of the lateral      territory, which has approximately 20% ostial stenosis.  The right      coronary artery also gives rise to an arterial division, which      encompasses the inferior wall, it is moderate in size with no      angiographically significant coronary artery disease.   1. Left ventriculogram - ejection fraction is reduced in the  30-40%      range.  Quantification will be performed.  Left ventricular      systolic pressure is 158 with an end-diastolic pressure of 11 mmHg.      Aortic pressure is 158/72, with a mean of 102 mmHg.  There is no      evidence of aortic stenosis.  There is no systolic mitral      regurgitation that is significant appreciated on left      ventriculogram.  The left ventricle is also mildly dilated.      Quantification of the left ventricle demonstrates an ejection      fraction of 37%.   IMPRESSION:  1. Minor irregularities throughout coronary arteries, most      specifically ostial circumflex and ostial ramus branch, but no      hemodynamically significant coronary artery disease present.  2. Moderately reduced left ventricular ejection fraction with      quantification of ejection fraction of 37%.  Global hypokinesis,      most notable in the anteroapical region.  3. Hypertension.   PLAN:  The patient will be transferred to a telemetry floor.  He does  have marked sinus bradycardia with heart rates in the 40s.  Therefore,   beta-blocker cannot be utilized currently.  If reduced LVEF does not  improve with current medical therapy, I will consider pacemaker  placement to allow for beta-blocker therapy. I will, however, initiate  low dose ACE inhibitor, lisinopril 5 mg once a day, and I will also  initiate hydralazine 25 mg 3 times a day as well as isosorbide  mononitrate 30 mg once a day.  Most likely etiology for his  cardiomyopathy is hypertension.  I will monitor overnight and if blood  pressure is stable tomorrow and creatinine is stable, we will likely  discharge home.      Jake Bathe, MD  Electronically Signed     MCS/MEDQ  D:  10/02/2008  T:  10/02/2008  Job:  [id number]   cc:   Francisca December, M.D.

## 2010-08-06 NOTE — Consult Note (Signed)
NAME:  Don Norman, Don Norman NO.:  [id number]   MEDICAL RECORD NO.:  [id number]          PATIENT TYPE:  INP   LOCATION:  3312                         FACILITY:  [facility]   PHYSICIAN:  Petra Kuba, M.D.    DATE OF BIRTH:  21-Dec-1945   DATE OF CONSULTATION:  09/29/2008  DATE OF DISCHARGE:                                 CONSULTATION   The patient is a 65 year old male with sudden onset of dyspnea and chest  pain that started the day prior to admission and lasted for 30 seconds  on multiple occasions.  The patient was admitted from emergency room  with possibility of STEMI for further cardiac workup.  The patient was  found to have elevated bilirubin and gastroenterology was consulted her  with continued management.  The patient report significant heartburn  symptoms in recent past.  The patient's symptoms dramatically improved  with continued use of omeprazole.  The patient reports that his chest  pain is now completely resolved.  At present, the patient does not  report any nausea, vomiting, fever, blood in stool.  The patient does  not report any weight loss.  The patient does not report any prior  history of liver disease, jaundice, or workup for high bilirubin.  The  patient reports headache that started after taking nitroglycerin.  The  patient has no urinary or bowel symptoms at this time.   ALLERGIES:  No drug allergies.   PAST MEDICAL HISTORY:  Hypertension and diabetes.   The patient has no prior surgical history.   SOCIAL HISTORY:  The patient used to smoke 19 years ago.  Does not smoke  at present.  The patient does not report any alcohol or drug use.  The  patient lives with his wife in [place].   REVIEW OF SYMPTOMS:  As per HPI.   MEDICATIONS AT HOME:  1. Omeprazole 40 mg once daily.  2. Lisinopril 40 mg once daily.  3. Metoprolol 12.5 mg twice daily.  4. Metformin 500 mg once a day.   LABORATORY DATA:  Hemoglobin is 15.7, white count of 9.3, and  platelet  219.  Sodium is 139, potassium 5.2, chloride 106, bicarb at 20, BUN is  16, creatinine 3.25, and platelet count 157.  The patient's bilirubin is  2.2, alkaline phosphatase is 84, AST is 63, ALT is 53, albumin is 4.2,  total protein is 8.1, and calcium is 9.4.   In comparison values obtained from ER visit in January 2006 are as  following, total bilirubin is 1.3, alkaline phosphatase is 73, AST is  33, ALT is 33, albumin is 3.8, total protein is 7.2, and calcium is 8.6.  A VQ scan on this admission is negative for pulmonary embolism.  Chest x-  ray is indicative of minimal cardiomegaly but no acute process.  The  patient's prior GI workup is as following.  The patient had a EGD on  January 29, 2006, done by Dr. Evette Cristal.  EGD had shown small 1 cm nodule in  esophagus, colon had polyps on colonoscopy that were subsequently found  to  be nonmalignant from the biopsy results.  The patient's esophageal  small nodule biopsy results are unclear and unavailable in the  electronic medical record at this time.  The patient did not got  followup TEE ultrasound as recommended as best the patient can remember.   PHYSICAL EXAMINATION:  VITAL SIGNS:  Temperature 99.1, blood pressure  154/77, respiratory rate of 18, heart rate of 58, and oxygen saturation  99% .  GENERAL:  The patient is in no acute distress.  The patient is  comfortable lying in the bed.  RESPIRATORY:  Clear to auscultation bilaterally.  No wheezing.  No  rales.  No rhonchi.  ABDOMEN:  Soft and nontender.  No guarding.  No rigidity.  No  hepatosplenomegaly.  Bowel sounds are normal at present.  CNS:  Alert and oriented x3.  Cranial nerves II through XII intact.  No  motor or sensory deficits.  Cerebellar signs are negative.  PSYCH:  Appropriate.  EXTREMITIES:  Within normal limits.   ASSESSMENT AND PLAN:  Elevated bilirubin.  The patient at present does  not report any abdominal pain.  The patient has no history of  gallstones  or hepatitis.  The patient's elevated bilirubin was also noted in  January 2006 upon ER visit, but not as high as observed at present.  The  patient does not report any nausea or vomiting associated with this  symptom at that time as well as on this admission.  We will attempt to  get prior lab work values including liver function test from his primary  care Nadina Fomby.  We will also get a complete abdominal ultrasound on this  admission to further evaluate liver and gallbladder.  .  We have asked  lab to fractionated bilirubin at this time.  If conjugated bilirubin is  high, we will get further workup that would include MRCP and/or ERCP by  dilatation or obstruction is suspected.  On the other hand if  unconjugated bilirubin is high, we will consider getting retic count,  review of peripheral smear LDH as well as haptoglobin.  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  We will continue to  follow and further evaluate this patient.  Patient ahd a small esophageal nodule noted on prior EGD. Given his  heartburn symptoms and previous EGD report, We will  also consider EGD  once patient is clinically stable.      Clerance Lav, MD PhD  Electronically Signed     ______________________________  Petra Kuba, M.D.    RS/MEDQ  D:  09/29/2008  T:  09/30/2008  Job:  [id number]

## 2010-08-06 NOTE — Discharge Summary (Signed)
Don Norman, Don Norman                ACCOUNT NO.:  [id number]   MEDICAL RECORD NO.:  [id number]          PATIENT TYPE:  INP   LOCATION:  4712                         FACILITY:  [facility]   PHYSICIAN:  Francisca December, M.D.  DATE OF BIRTH:  Feb 13, 1946   DATE OF ADMISSION:  09/28/2008  DATE OF DISCHARGE:  10/03/2008                               DISCHARGE SUMMARY   DISCHARGE DIAGNOSES:  1. 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  2. Nonobstructive coronary artery disease.  3. Alcohol abuse, cessation counseling.  4. Hypothyroidism, Synthroid added.  5. Acute renal insufficiency felt to be secondary to ACE inhibitor and      dehydration, resolved.  6. Elevated bilirubin felt secondary to [employer] syndrome.  No specific      management was required.  7. Hypertension.  8. Diabetes mellitus.   HOSPITAL COURSE:  Don Norman is a 65 year old male patient, who  presented to [facility] ER with acute shortness of breath episodes x2 without  diaphoresis.  He denies chest pain, but it is clutching his chest with  each episode, lasting 3-5 minutes.  He had 1 episode observed in the  emergency room.  It resolved with oxygen, sublingual and IV  nitroglycerin.  He states he has had previous cardiac catheterization  about 5-7 years ago without obstruction.  EKG shows left bundle-branch  block.  Cardiac enzymes were negative.  We did a V/Q scan and this was  negative.   His TSH was 5.354, and we started him on low-dose Synthroid.  [facility]  He was noted to have a BUN of 16 and  creatinine of 3.25.  We did have the renal team involved and they  thought it was a combination of dehydration plus ACE inhibitor.  ACE  inhibitor was temporarily stopped.   He also had an elevated T-bilirubin of 2.2.  This was felt to be [name]  syndrome, and no specific management was required.   Once his creatinine was back to normal, he underwent a  cardiac  catheterization and was found to have an EF of about 35% with global  hypokinesis.  He had a minimal ostial ramus, diagonal, and circ disease,  20%.  A medium-sized diagonal with ostial 30% lesion.  His  cardiomyopathy was felt to be nonobstructive in nature and was felt to  be more likely to his alcohol use.   Since his creatinine had improved back to baseline 58f 0.9, we felt it  was pertinent that he get back on ACE inhibitor, although we started it  at a much lower dose, lisinopril 5 mg a day.  After being on it for a  day, his BUN was 7 and creatinine 0.86.  He seems to be doing well with  a combination of ACE inhibitor, hydralazine, and Imdur.  He does not  appear to be in heart failure.  He did have some bradycardia into the  40s, especially when he was asleep, this was asymptomatic, but because  of this, we could not  use a beta-blocker.   DISCHARGE MEDICATIONS:  1. He is to stop lisinopril 40 mg a day and his metoprolol.  2. He is to start lisinopril 5 mg a day.  3. Metformin 500 mg as before.  4. Omeprazole 20 mg as before.  5. Enteric-coated aspirin 325 mg a day.  6. Synthroid 25 mcg a day.  7. Hydralazine 25 mg t.i.d.  8. Imdur 30 mg a day.   He is to remain on a low-sodium heart-healthy diabetic diet.  Clean cath  site gently with soap and water.  No scrubbing.  Increase activity  slowly.  No lifting over 10 pounds for 1 week.  No driving for 2 days.  Follow with Dr. Amil Amen on October 11, 2008, at 1 p.m.  He is to arrive 30  minutes early for a BMET to recheck his renal function.  He is supposed  to see Dr. Vonita Moss tomorrow at the [place], [place] and I have asked him to  go ahead and cancel this because I did not feel that he needs to be seen  tomorrow since he has had such an extensive workup here.  I have asked  him to reschedule that for a month away.      Guy Franco, P.A.      Francisca December, M.D.  Electronically Signed    LB/MEDQ  D:  10/03/2008  T:   10/03/2008  Job:  [id number]   cc:   Dr. Vonita Moss

## 2012-07-08 ENCOUNTER — Emergency Department (HOSPITAL_COMMUNITY)
Admission: EM | Admit: 2012-07-08 | Discharge: 2012-07-08 | Disposition: A | Discharge status: Home / Self Care | Payer: Self-pay | Attending: Emergency Medicine | Admitting: Emergency Medicine

## 2012-07-08 ENCOUNTER — Emergency Department (HOSPITAL_COMMUNITY): Payer: Self-pay

## 2012-07-08 ENCOUNTER — Encounter (HOSPITAL_COMMUNITY): Payer: Self-pay | Admitting: Emergency Medicine

## 2012-07-08 DIAGNOSIS — I2510 Atherosclerotic heart disease of native coronary artery without angina pectoris: Secondary | ICD-10-CM | POA: Insufficient documentation

## 2012-07-08 DIAGNOSIS — I10 Essential (primary) hypertension: Secondary | ICD-10-CM | POA: Insufficient documentation

## 2012-07-08 DIAGNOSIS — J3489 Other specified disorders of nose and nasal sinuses: Secondary | ICD-10-CM | POA: Insufficient documentation

## 2012-07-08 DIAGNOSIS — R0789 Other chest pain: Secondary | ICD-10-CM | POA: Insufficient documentation

## 2012-07-08 DIAGNOSIS — J209 Acute bronchitis, unspecified: Secondary | ICD-10-CM | POA: Insufficient documentation

## 2012-07-08 HISTORY — DX: Essential (primary) hypertension: I10

## 2012-07-08 LAB — BASIC METABOLIC PANEL
BUN: 10 mg/dL (ref 6–23)
Calcium: 9.1 mg/dL (ref 8.4–10.5)
Creatinine, Ser: 0.81 mg/dL (ref 0.50–1.35)
GFR calc non Af Amer: 90 mL/min — ABNORMAL LOW (ref >90)
Glucose, Bld: 112 mg/dL — ABNORMAL HIGH (ref 70–99)
Sodium: 138 mEq/L (ref 135–145)

## 2012-07-08 LAB — CBC
Hemoglobin: 16.2 g/dL (ref 13.0–17.0)
MCH: 32.9 pg (ref 26.0–34.0)
MCHC: 35.4 g/dL (ref 30.0–36.0)
MCV: 92.9 fL (ref 78.0–100.0)

## 2012-07-08 IMAGING — CR DG CHEST 2V
2 series · 2 of 2 positions shown · non-contrast
Comparison: Portable chest x-ray of [DATE]

CLINICAL DATA: Shortness of breath, chest pain, cough and
congestion intermittently for 5 months

CHEST - 2 VIEW

[w chest pa]
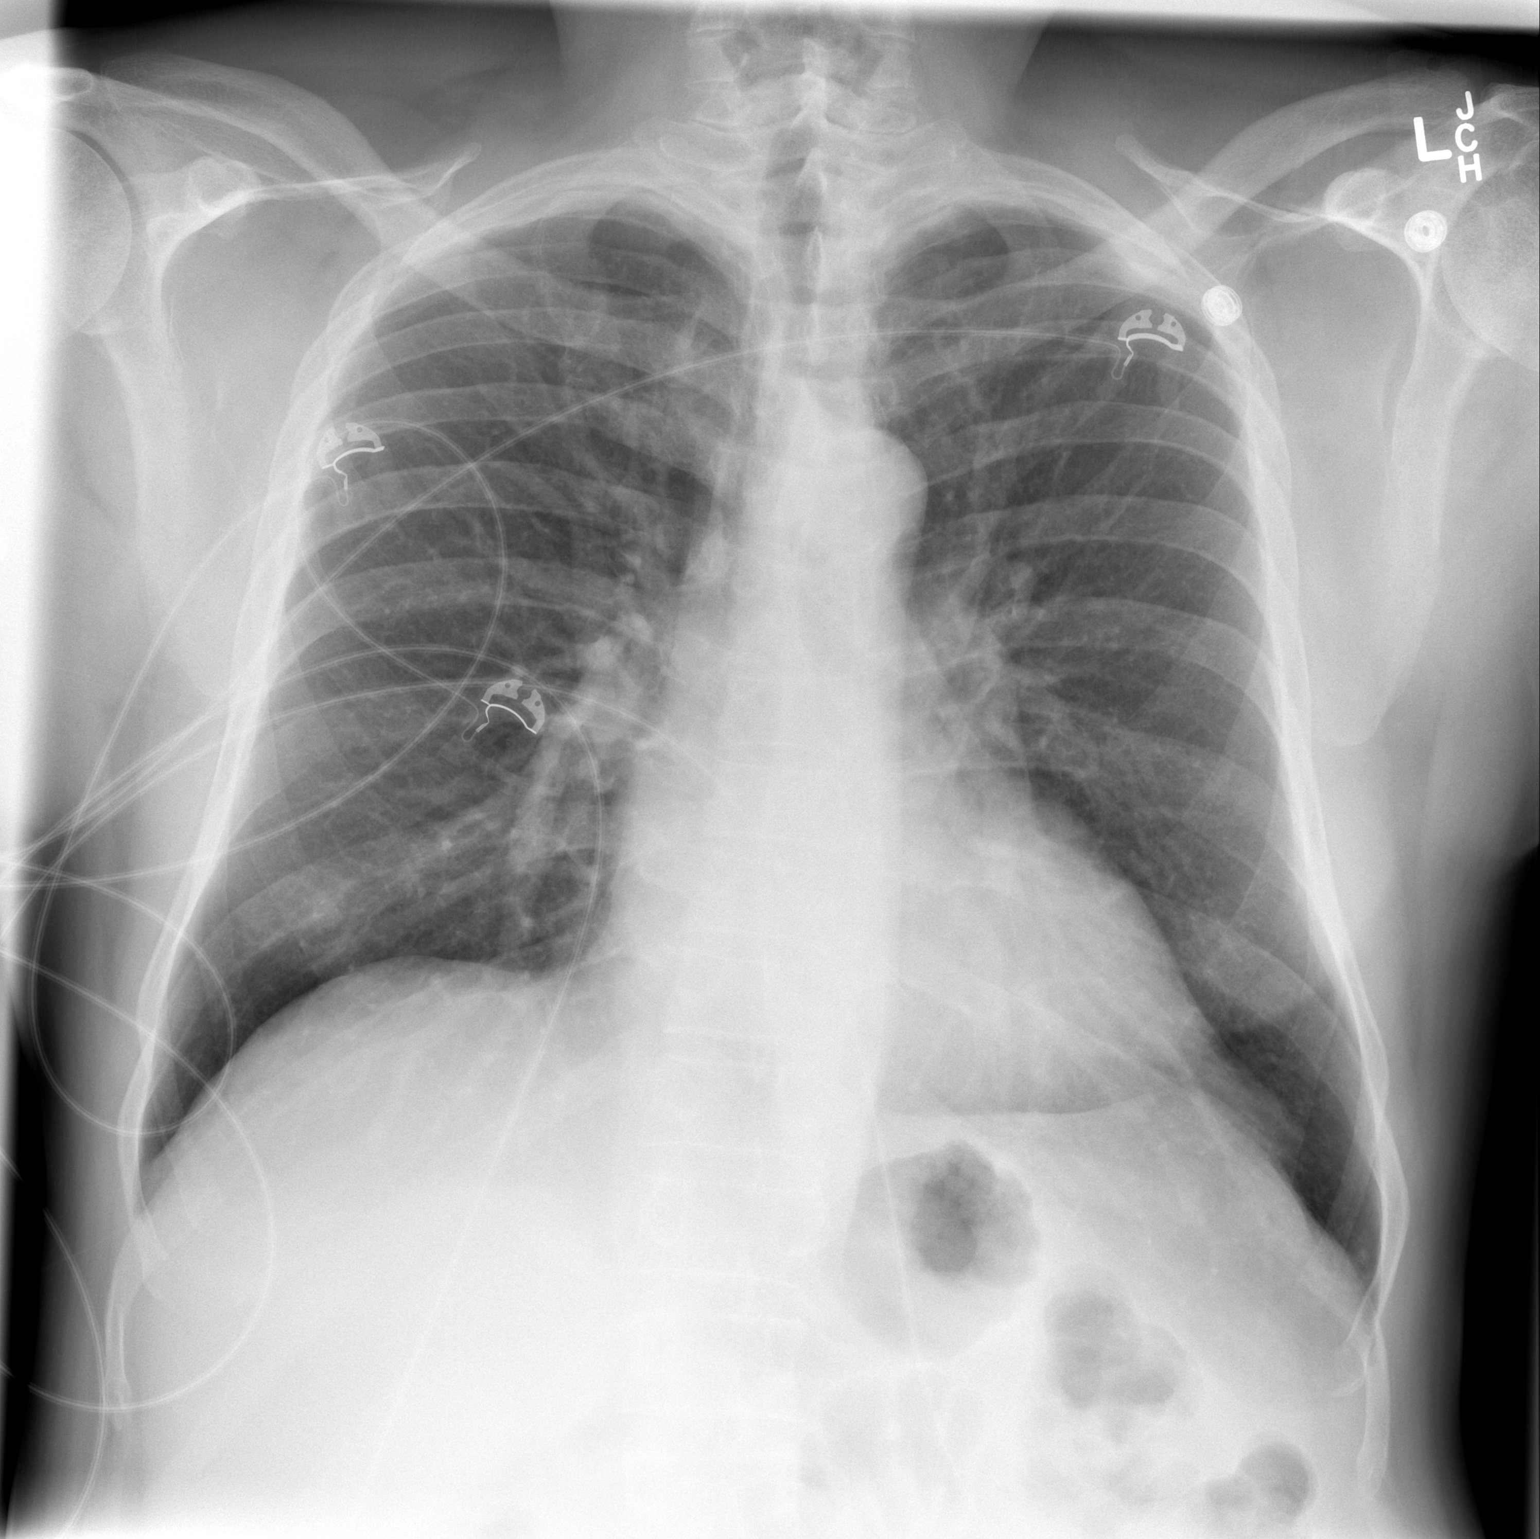

[w chest lat]
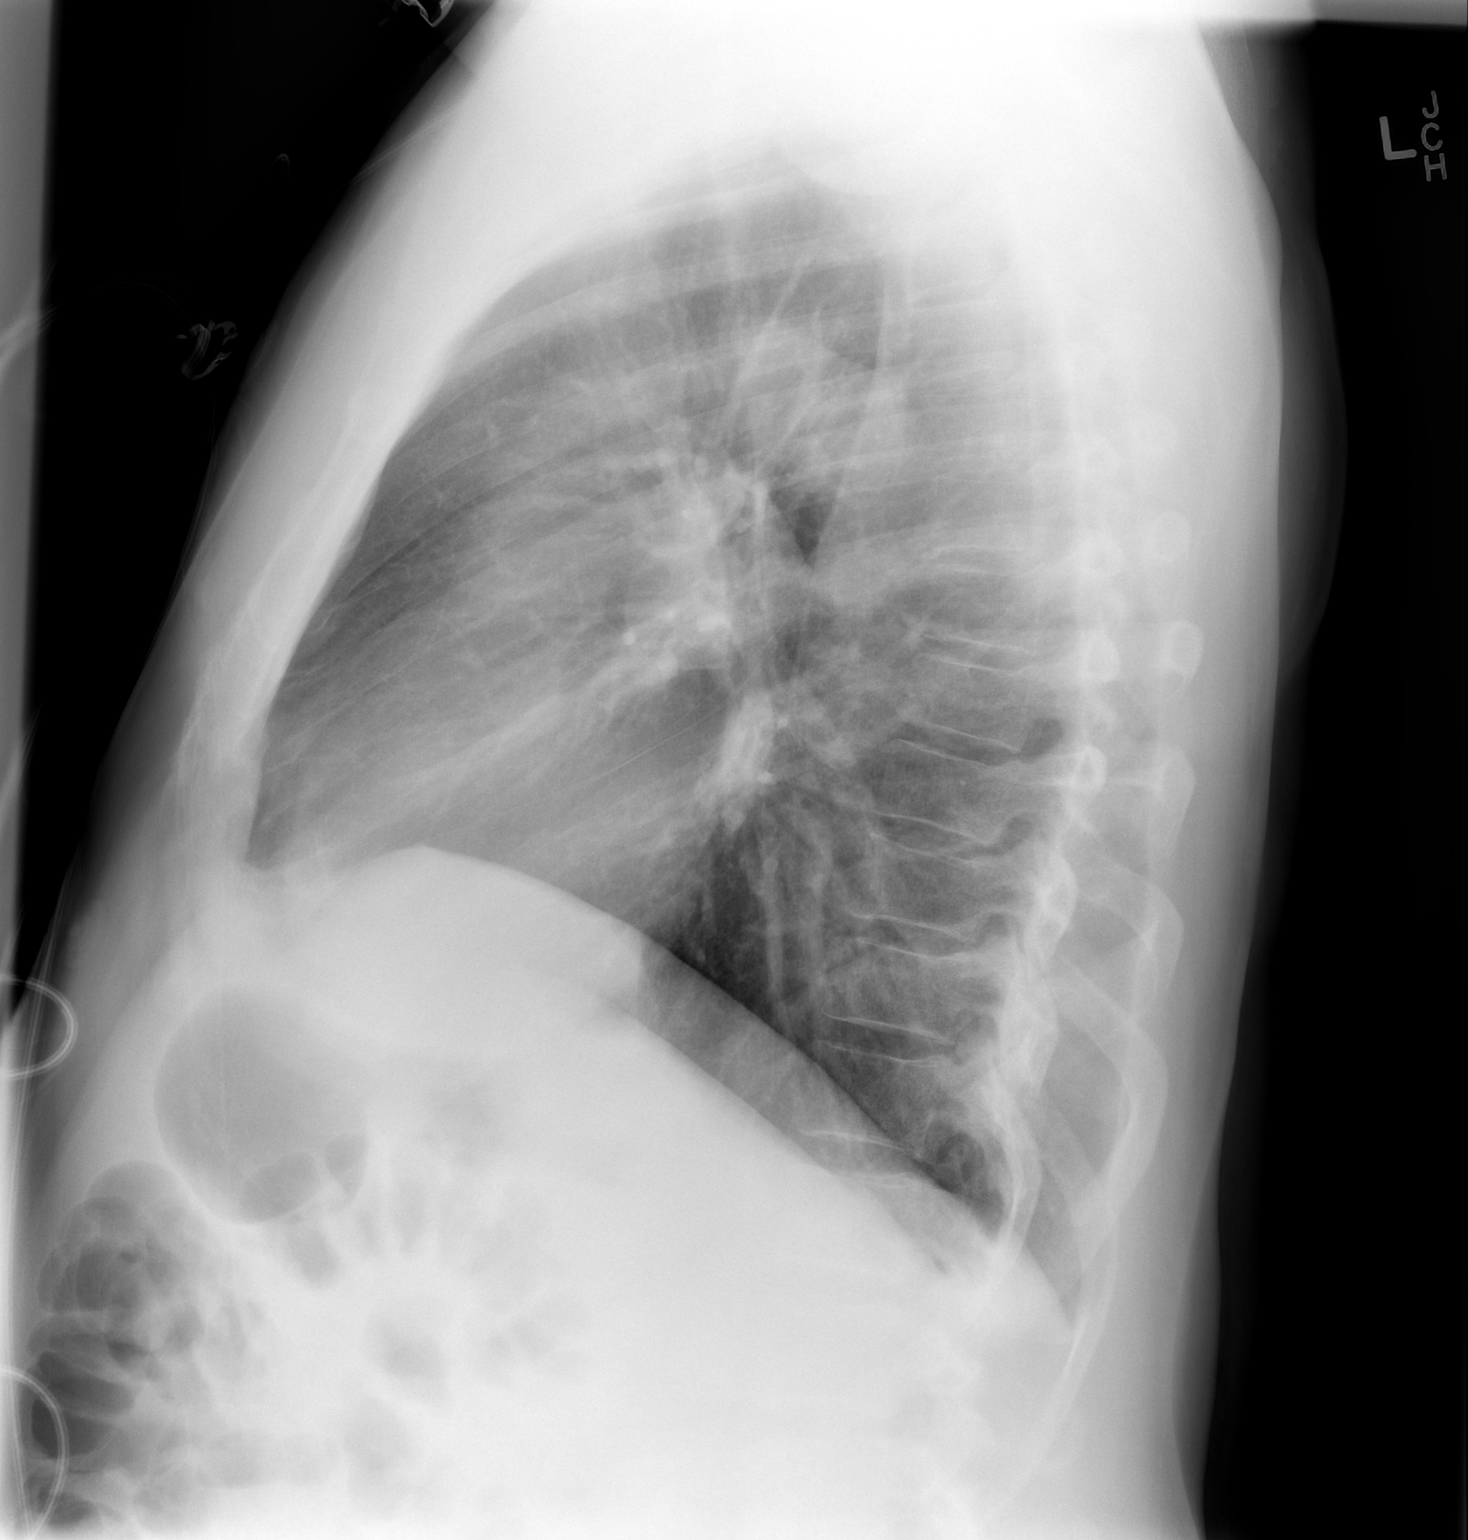

[2 of 2 positions shown; findings below may reference images not displayed]

FINDINGS: No active infiltrate or effusion is seen.  There is mild
cardiomegaly present and there is very slight prominence of the
perihilar markings with peribronchial thickening.  These findings
may indicate bronchitis with no evidence of congestive heart
failure currently.  No bony abnormality is seen.
IMPRESSION: 1.  Question bronchitis with somewhat prominent perihilar markings.
2.  Stable mild cardiomegaly.

## 2012-07-08 MED ORDER — AZITHROMYCIN 250 MG PO TABS: ORAL_TABLET | ORAL | Status: DC

## 2012-07-08 MED ORDER — HYDROCOD POLST-CHLORPHEN POLST 10-8 MG/5ML PO LQCR: 5.0000 mL | Freq: Two times a day (BID) | ORAL | Status: DC | PRN

## 2012-07-08 MED ORDER — AZITHROMYCIN 250 MG PO TABS
500.0000 mg | ORAL_TABLET | Freq: Once | ORAL | Status: AC
  Administered 2012-07-08: 500 mg via ORAL
  Filled 2012-07-08: qty 2

## 2012-07-08 MED ORDER — HYDROCOD POLST-CHLORPHEN POLST 10-8 MG/5ML PO LQCR
5.0000 mL | Freq: Once | ORAL | Status: AC
  Administered 2012-07-08: 5 mL via ORAL
  Filled 2012-07-08: qty 5

## 2012-07-08 NOTE — ED Notes (Signed)
Pt c/o productive cough with sputum; pt sts pain in chest with cough and HA

## 2012-07-08 NOTE — ED Provider Notes (Signed)
History     CSN: [id number]  Arrival date & time 07/08/12  1649   First MD Initiated Contact with Patient 07/08/12 1856      Chief Complaint  Patient presents with  . Cough  . Chest Pain    (Consider location/radiation/quality/duration/timing/severity/associated sxs/prior treatment) HPI Comments: Patient is a 67 year old man who says he has had a cough on and off since December of this year. He has significant nasal congestion, and that sometimes sets off a coughing spell. What is new is that today when he takes a deep breath or coughs he feels the pain in the precordial region under his sternum. He therefore sought evaluation. He has had prior chest pain evaluation, with a cardiac cath in 2010 that showed minimal disease. He has an EKG that shows some nonspecific intraventricular block.  Patient is a 67 y.o. male presenting with cough. The history is provided by the patient and a significant other. No language interpreter was used.  Cough Cough characteristics:  Non-productive Severity:  Moderate Onset quality:  Gradual Duration: He has had a cough for several months. He has developed a pleuritic chest pain over the past day. Timing:  Intermittent Progression:  Worsening Chronicity:  Recurrent Smoker: no   Relieved by: He has tried over-the-counter cough medicines without relief. Worsened by:  Nothing tried Associated symptoms: chest pain and rhinorrhea   Associated symptoms: no chills and no fever   Associated symptoms comment:  Rhinorrhea. Chest pain:    Chest pain quality: He has a pleuritic chest pain with a deep breath or cough, fell out of the lower sternum.   Severity:  Moderate   Duration:  1 day   Timing:  Intermittent   Progression:  Unchanged   Chronicity:  New   Past Medical History  Diagnosis Date  . Coronary artery disease   . Hypertension     History reviewed. No pertinent past surgical history.  History reviewed. No pertinent family  history.  History  Substance Use Topics  . Smoking status: Never Smoker   . Smokeless tobacco: Not on file  . Alcohol Use: Yes      Review of Systems  Constitutional: Negative for fever and chills.  HENT: Positive for congestion and rhinorrhea.   Eyes: Negative.   Respiratory: Positive for cough.   Cardiovascular: Positive for chest pain.  Gastrointestinal: Negative.   Genitourinary: Negative.   Musculoskeletal: Negative.   Skin: Negative.   Neurological: Negative.   Psychiatric/Behavioral: Negative.     Allergies  Review of patient's allergies indicates no known allergies.  Home Medications   Current Outpatient Rx  Name  Route  Sig  Dispense  Refill  . acetaminophen (TYLENOL) 500 MG tablet   Oral   Take 1,000 mg by mouth every 8 (eight) hours as needed for pain.         Marland Kitchen guaifenesin (ROBITUSSIN) 100 MG/5ML syrup   Oral   Take 600 mg by mouth daily as needed for cough (takes 30 ml's).          . Pseudoephedrine-DM-GG-APAP (SUDAFED COLD/COUGH PO)   Oral   Take 1 tablet by mouth every 6 (six) hours as needed (for  congestion).           There were no vitals taken for this visit.  Physical Exam  Nursing note and vitals reviewed. Constitutional: He is oriented to person, place, and time. He appears well-developed and well-nourished. Distressed: in mild distress, complaining of pleuritic chest pain.  HENT:  Head: Normocephalic and atraumatic.  Right Ear: External ear normal.  Left Ear: External ear normal.  Mouth/Throat: Oropharynx is clear and moist.  Eyes: Conjunctivae and EOM are normal. Pupils are equal, round, and reactive to light.  Neck: Normal range of motion. Neck supple.  He has a clear rhinorrhea. His nasal turbinates are swollen.  Cardiovascular: Normal rate, regular rhythm and normal heart sounds.   Pulmonary/Chest: Effort normal and breath sounds normal. He exhibits no tenderness.  Abdominal: Soft. Bowel sounds are normal.   Musculoskeletal: Normal range of motion. He exhibits no edema and no tenderness.  Neurological: He is alert and oriented to person, place, and time.  No sensory or motor deficit.  Skin: Skin is warm and dry.  Psychiatric: He has a normal mood and affect. His behavior is normal.    ED Course  Procedures (including critical care time)  Labs Reviewed  BASIC METABOLIC PANEL - Abnormal; Notable for the following:    Glucose, Bld 112 (*)    GFR calc non Af Amer 90 (*)    All other components within normal limits  CBC  POCT I-STAT TROPONIN I    Date: 07/08/2012  Rate: 85  Rhythm: normal sinus rhythm  QRS Axis: normal  Intervals: QT prolonged  ST/T Wave abnormalities: normal  Conduction Disutrbances:nonspecific intraventricular conduction delay  Narrative Interpretation: Abnormal EKG  Old EKG Reviewed: unchanged--Appear similar to tracing of 05/24/2009.    7:29 PM Results for orders placed during the hospital encounter of 07/08/12  CBC      Result Value Range   WBC 4.9  4.0 - 10.5 K/uL   RBC 4.92  4.22 - 5.81 MIL/uL   Hemoglobin 16.2  13.0 - 17.0 g/dL   HCT 16.1  09.6 - 04.5 %   MCV 92.9  78.0 - 100.0 fL   MCH 32.9  26.0 - 34.0 pg   MCHC 35.4  30.0 - 36.0 g/dL   RDW 40.9  81.1 - 91.4 %   Platelets 172  150 - 400 K/uL  BASIC METABOLIC PANEL      Result Value Range   Sodium 138  135 - 145 mEq/L   Potassium 3.8  3.5 - 5.1 mEq/L   Chloride 102  96 - 112 mEq/L   CO2 25  19 - 32 mEq/L   Glucose, Bld 112 (*) 70 - 99 mg/dL   BUN 10  6 - 23 mg/dL   Creatinine, Ser 7.82  0.50 - 1.35 mg/dL   Calcium 9.1  8.4 - 95.6 mg/dL   GFR calc non Af Amer 90 (*) >90 mL/min   GFR calc Af Amer >90  >90 mL/min  POCT I-STAT TROPONIN I      Result Value Range   Troponin i, poc 0.01  0.00 - 0.08 ng/mL   Comment 3            7:29 PM Pt was seen and has physical examination.  Lab workup and EKG are benign.  Chest x-ray ordered.  8:03 PM Results for orders placed during the hospital encounter  of 07/08/12  CBC      Result Value Range   WBC 4.9  4.0 - 10.5 K/uL   RBC 4.92  4.22 - 5.81 MIL/uL   Hemoglobin 16.2  13.0 - 17.0 g/dL   HCT 21.3  08.6 - 57.8 %   MCV 92.9  78.0 - 100.0 fL   MCH 32.9  26.0 - 34.0 pg   MCHC 35.4  30.0 -  36.0 g/dL   RDW 47.8  29.5 - 62.1 %   Platelets 172  150 - 400 K/uL  BASIC METABOLIC PANEL      Result Value Range   Sodium 138  135 - 145 mEq/L   Potassium 3.8  3.5 - 5.1 mEq/L   Chloride 102  96 - 112 mEq/L   CO2 25  19 - 32 mEq/L   Glucose, Bld 112 (*) 70 - 99 mg/dL   BUN 10  6 - 23 mg/dL   Creatinine, Ser 3.08  0.50 - 1.35 mg/dL   Calcium 9.1  8.4 - 65.7 mg/dL   GFR calc non Af Amer 90 (*) >90 mL/min   GFR calc Af Amer >90  >90 mL/min  POCT I-STAT TROPONIN I      Result Value Range   Troponin i, poc 0.01  0.00 - 0.08 ng/mL   Comment 3            Dg Chest 2 View  07/08/2012  *RADIOLOGY REPORT*  Clinical Data: Shortness of breath, chest pain, cough and congestion intermittently for 5 months  CHEST - 2 VIEW  Comparison: Portable chest x-ray of 06/27/2009  Findings: No active infiltrate or effusion is seen.  There is mild cardiomegaly present and there is very slight prominence of the perihilar markings with peribronchial thickening.  These findings may indicate bronchitis with no evidence of congestive heart failure currently.  No bony abnormality is seen.  IMPRESSION:  1.  Question bronchitis with somewhat prominent perihilar markings. 2.  Stable mild cardiomegaly.   Original Report Authenticated By: Dwyane Dee, M.D.     Advised patient ID have bronchitis. Treatment with azithromycin and Tussionex.   1. Acute bronchitis            Carleene Cooper III, MD 07/08/12 2007

## 2012-12-14 IMAGING — CR DG KNEE COMPLETE 4+V*R*
4 series · 4 of 4 positions shown · non-contrast
Comparison: None.

CLINICAL DATA: Right knee pain.

EXAM:
RIGHT KNEE - COMPLETE 4+ VIEW

[t knee ap right]
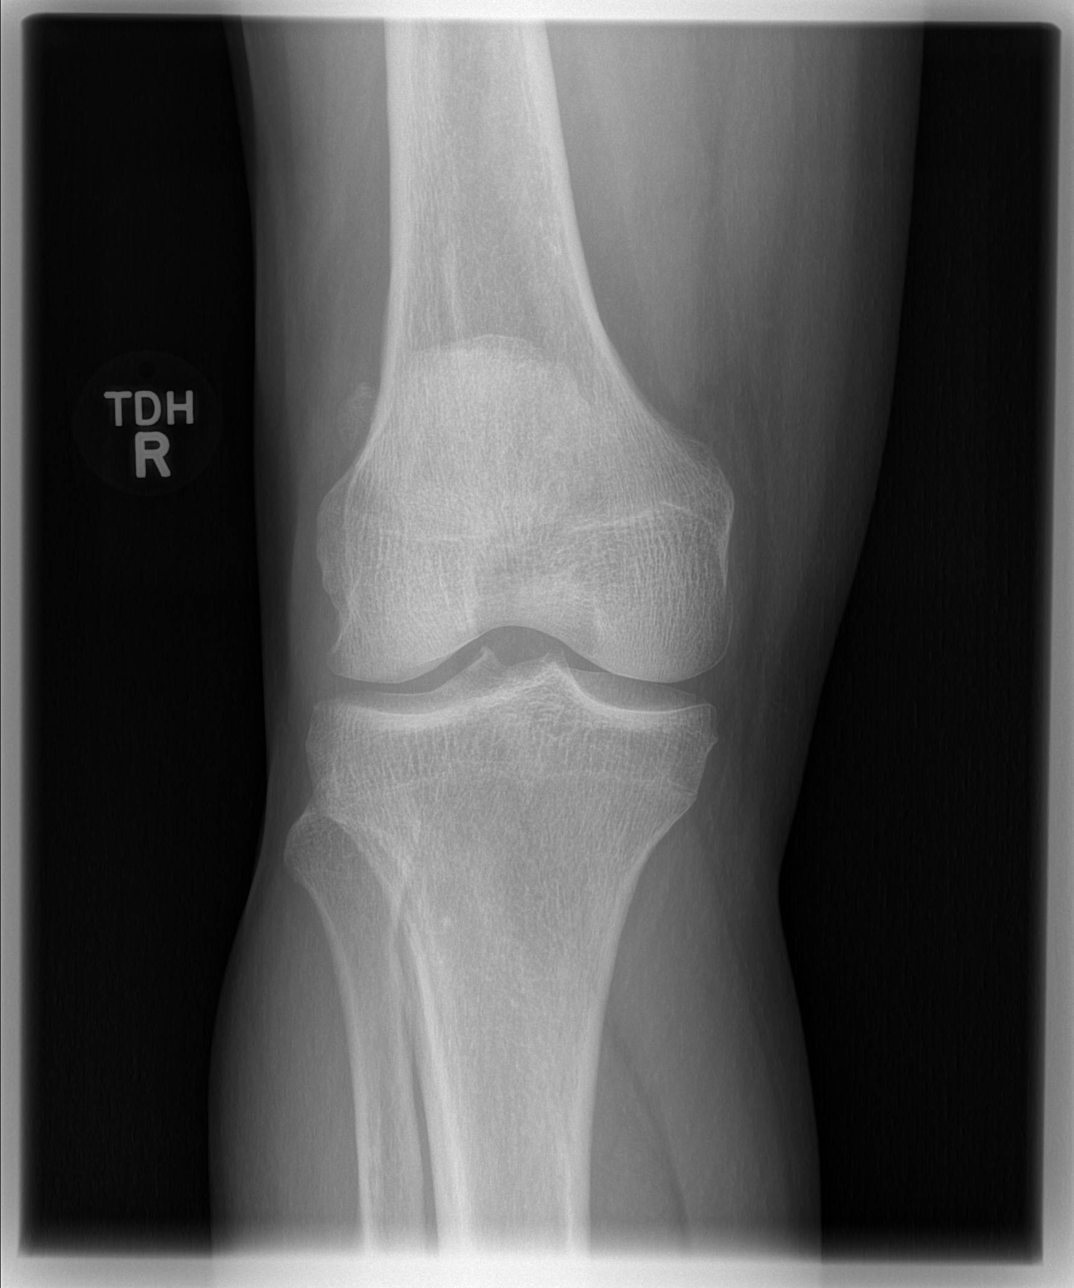

[t knee oblique right (1 of 2)]
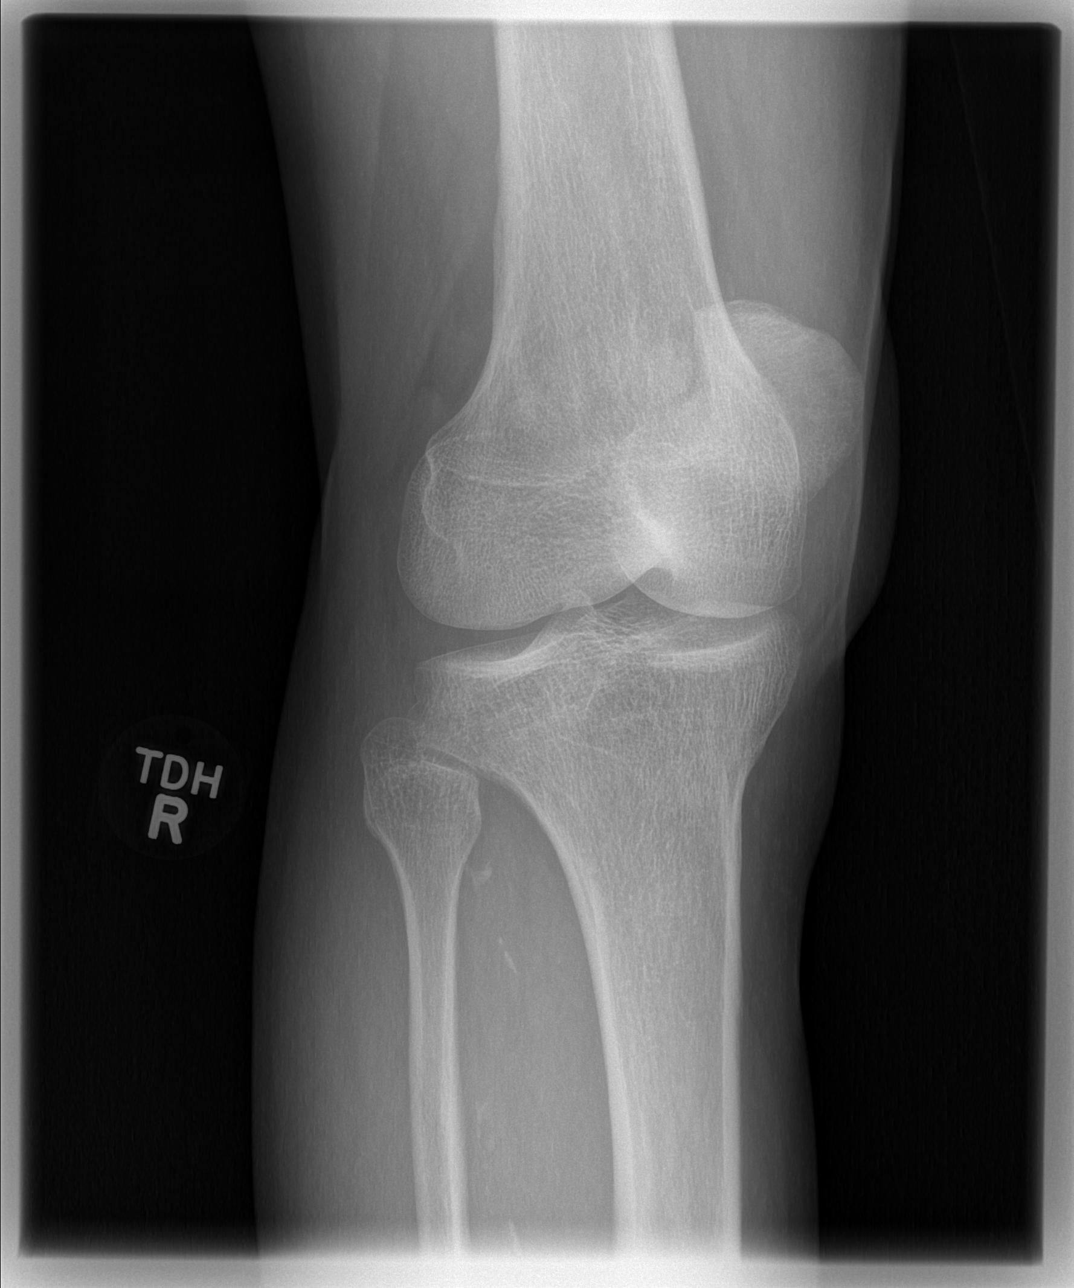

[t knee oblique right (2 of 2)]
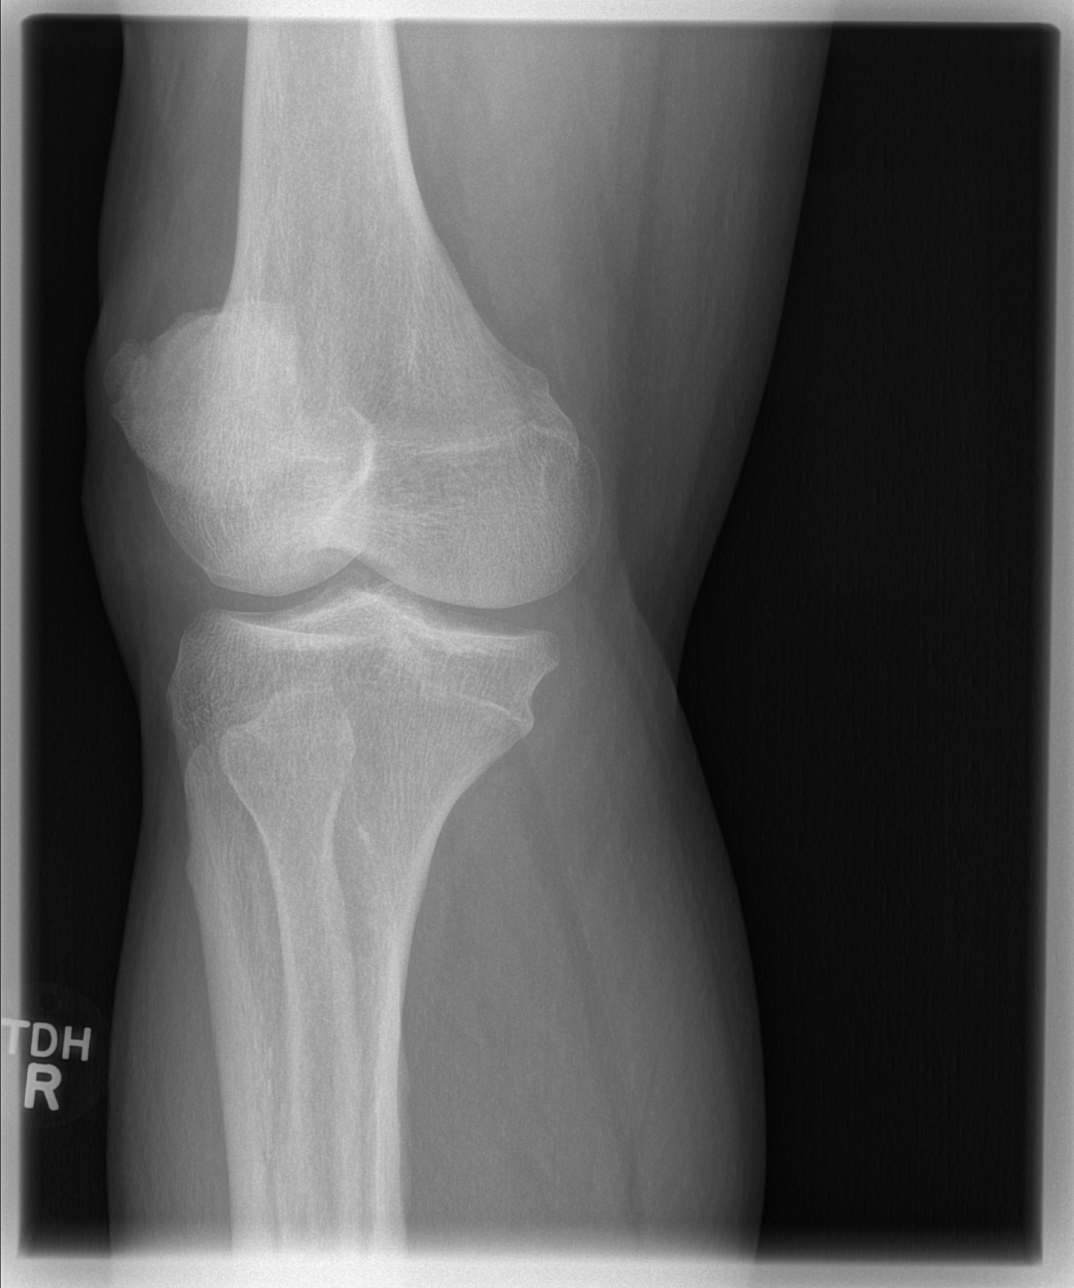

[t knee lat right]
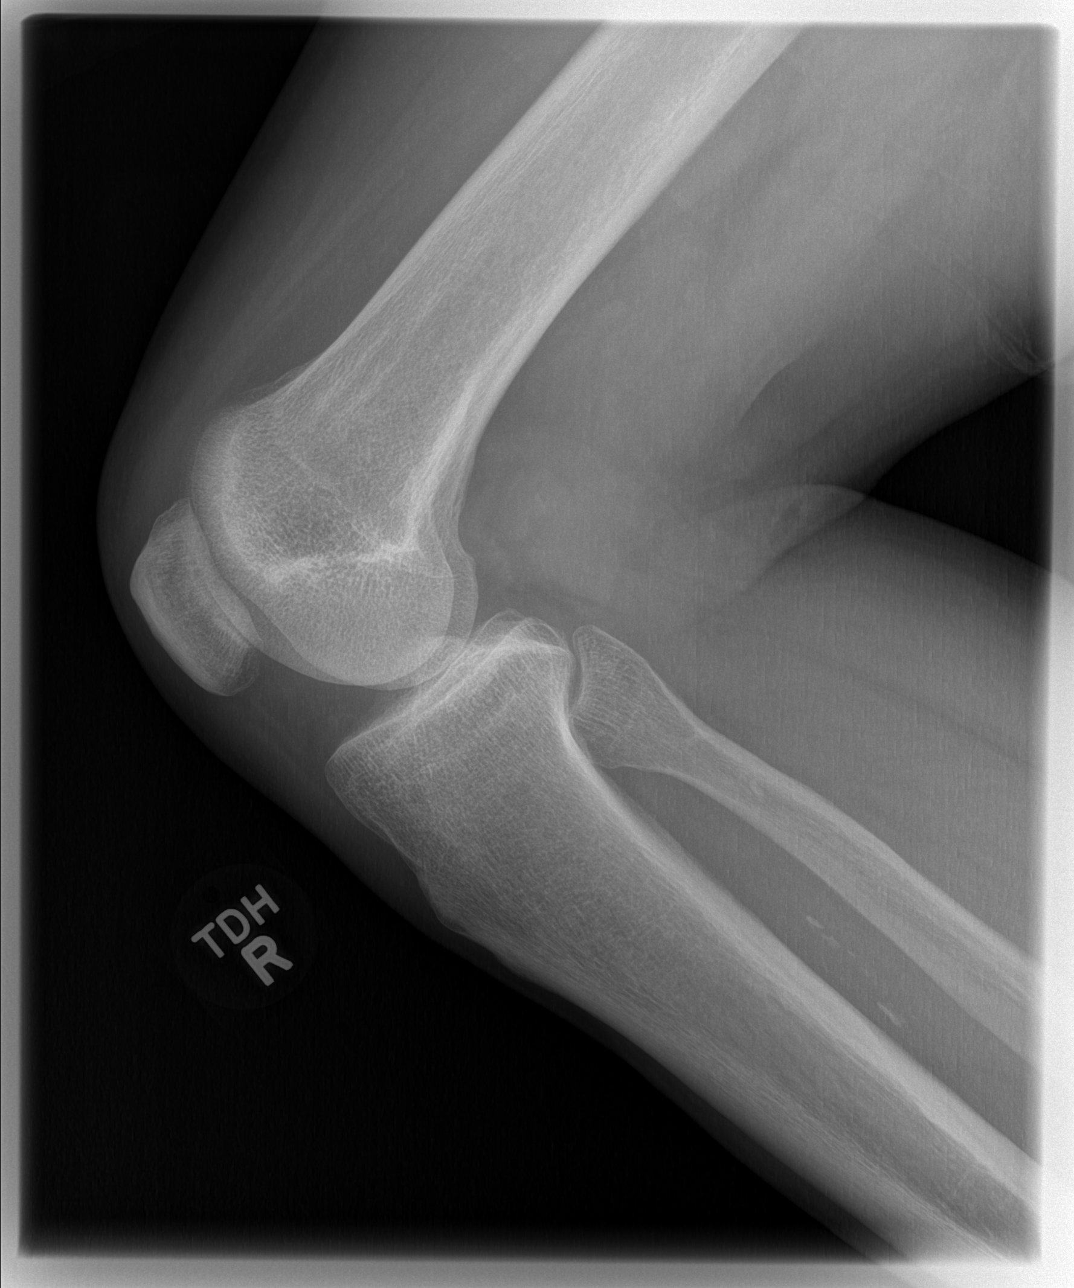

[4 of 4 positions shown; findings below may reference images not displayed]

FINDINGS: There is no evidence of fracture, dislocation, or joint effusion.
Mild proliferative changes are seen involving the tibial spines.
There is a bipartite patella there. No bony lesions or destruction
identified. Soft tissues are unremarkable.
IMPRESSION: No acute fracture.  Mild proliferative changes.

## 2012-12-26 ENCOUNTER — Encounter (HOSPITAL_COMMUNITY): Payer: Self-pay | Admitting: Nurse Practitioner

## 2012-12-26 ENCOUNTER — Emergency Department (HOSPITAL_COMMUNITY): Payer: Non-veteran care

## 2012-12-26 ENCOUNTER — Emergency Department (HOSPITAL_COMMUNITY)
Admission: EM | Admit: 2012-12-26 | Discharge: 2012-12-26 | Disposition: A | Discharge status: Home / Self Care | Payer: Non-veteran care | Attending: Emergency Medicine | Admitting: Emergency Medicine

## 2012-12-26 DIAGNOSIS — M25561 Pain in right knee: Secondary | ICD-10-CM

## 2012-12-26 DIAGNOSIS — I2510 Atherosclerotic heart disease of native coronary artery without angina pectoris: Secondary | ICD-10-CM | POA: Insufficient documentation

## 2012-12-26 DIAGNOSIS — I10 Essential (primary) hypertension: Secondary | ICD-10-CM | POA: Insufficient documentation

## 2012-12-26 DIAGNOSIS — M25569 Pain in unspecified knee: Secondary | ICD-10-CM | POA: Insufficient documentation

## 2012-12-26 HISTORY — DX: Heart disease, unspecified: I51.9

## 2012-12-26 MED ORDER — HYDROCODONE-ACETAMINOPHEN 5-325 MG PO TABS: 2.0000 | ORAL_TABLET | ORAL | Status: DC | PRN

## 2012-12-26 NOTE — Progress Notes (Signed)
Orthopedic Tech Progress Note Patient Details:  Don Norman [birth year] [id number]  Ortho Devices Type of Ortho Device: Knee Sleeve Ortho Device/Splint Interventions: Application   [name], [name] 12/26/2012, 11:05 AM

## 2012-12-26 NOTE — ED Provider Notes (Signed)
Medical screening examination/treatment/procedure(s) were performed by non-physician practitioner and as supervising physician I was immediately available for consultation/collaboration.  Flint Melter, MD 12/26/12 [phone]

## 2012-12-26 NOTE — ED Notes (Signed)
States his R knee "started aching yesterday and its getting worse." denies any injuries, denies history of knee pain. Ambulatory, cms intact

## 2012-12-26 NOTE — ED Provider Notes (Signed)
CSN: [id number]     Arrival date & time 12/26/12  0920 History  This chart was scribed for Fayrene Helper, PA working with Flint Melter, MD by Quintella Reichert, ED Scribe. This patient was seen in room TR09C/TR09C and the patient's care was started at 10:40 AM.  Chief Complaint  Patient presents with  . Knee Pain    The history is provided by the patient. No language interpreter was used.    HPI Comments: Don Norman is a 67 y.o. male who presents to the Emergency Department complaining of progressively-worsening severe right knee pain that began yesterday.  Pt states that he first noticed the pain after he got off from work yesterday at 3 PM.  He denies any injuries or unusual activities at work.  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  He has had this job for 9 month.  Pain came on gradually and has been progressively-worsening.  Presently he rates severity at 10/10.  He describes pain as "just a pain, I've never felt anything like this in my life."  It is non-radiating.  Pain is not exacerbated by bending but he states that the knee feels slightly "stiff" with bending.  He denies pain to hip, ankle, or any other area.  He denies fever, numbness, urinary or bowel symptoms, CP, SOB, abdominal pain, nausea, emesis or any other associated symptoms.     Past Medical History  Diagnosis Date  . Coronary artery disease   . Hypertension   . Heart disease     History reviewed. No pertinent past surgical history.   History reviewed. No pertinent family history.   History  Substance Use Topics  . Smoking status: Never Smoker   . Smokeless tobacco: Not on file  . Alcohol Use: Yes     Review of Systems  Constitutional: Negative for fever.  Respiratory: Negative for shortness of breath.   Cardiovascular: Negative for chest pain.  Gastrointestinal: Negative for nausea, vomiting, abdominal pain and diarrhea.  Genitourinary: Negative for difficulty urinating.   Musculoskeletal: Positive for arthralgias.  Neurological: Negative for numbness.     Allergies  Review of patient's allergies indicates no known allergies.  Home Medications  No current outpatient prescriptions on file.  BP 158/72  Pulse 64  Temp(Src) 97.2 F (36.2 C) (Oral)  Resp 20  SpO2 98%  Physical Exam  Nursing note and vitals reviewed. Constitutional: He is oriented to person, place, and time. He appears well-developed and well-nourished. No distress.  HENT:  Head: Normocephalic and atraumatic.  Eyes: EOM are normal.  Neck: Neck supple. No tracheal deviation present.  Cardiovascular: Normal rate and intact distal pulses.   Good pedal pulses.  Pulmonary/Chest: Effort normal. No respiratory distress.  Musculoskeletal:       Right knee: He exhibits normal range of motion, no swelling, no deformity and no erythema. No tenderness found.       Right ankle: He exhibits normal range of motion.       Right lower leg: He exhibits no edema.       Left lower leg: He exhibits no edema.  Full flexion and extension of the right knee.  No focal point tenderness, no overlying swelling, no erythema, no deformity. Negative anterior and posterior drawer test. Normal varus and valgus maneuver. Full ROM to right ankle  Bilateral lower extremities without palpable cords.  No erythema, no edema.  Negative Homan's sign.  Neurological: He is alert and oriented to person, place,  and time.  Skin: Skin is warm and dry. No erythema.  Psychiatric: He has a normal mood and affect. His behavior is normal.    ED Course  Procedures (including critical care time)  DIAGNOSTIC STUDIES: Oxygen Saturation is 98% on room air, normal by my interpretation.    COORDINATION OF CARE: 10:46 AM- Do not suspect gout, septic arthritis or DVT.  Informed pt that imaging and evaluation do not suggest any emergent conditions but cause of pt's pain is not clear.  Discussed treatment plan which includes  orthopedic f/u.  Advised return precautions.  Pt expressed understanding and agreed with plan.   Labs Review Labs Reviewed - No data to display  Imaging Review Dg Knee Complete 4 Views Right  12/26/2012   CLINICAL DATA:  Right knee pain.  EXAM: RIGHT KNEE - COMPLETE 4+ VIEW  COMPARISON:  None.  FINDINGS: There is no evidence of fracture, dislocation, or joint effusion. Mild proliferative changes are seen involving the tibial spines. 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 No bony lesions or destruction identified. Soft tissues are unremarkable.  IMPRESSION: No acute fracture.  Mild proliferative changes.   Electronically Signed   By: Irish Lack M.D.   On: 12/26/2012 10:37    MDM   1. Knee pain, right anterior    BP 158/72  Pulse 64  Temp(Src) 97.2 F (36.2 C) (Oral)  Resp 20  SpO2 98%  I have reviewed nursing notes and vital signs. I personally reviewed the imaging tests through PACS system  I reviewed available ER/hospitalization records thought the EMR    I personally performed the services described in this documentation, which was scribed in my presence. The recorded information has been reviewed and is accurate.     Fayrene Helper, PA-C 12/26/12 1105

## 2014-11-03 ENCOUNTER — Emergency Department (HOSPITAL_COMMUNITY)
Admission: EM | Admit: 2014-11-03 | Discharge: 2014-11-03 | Disposition: A | Discharge status: Home / Self Care | Payer: Non-veteran care | Attending: Emergency Medicine | Admitting: Emergency Medicine

## 2014-11-03 ENCOUNTER — Encounter (HOSPITAL_COMMUNITY): Payer: Self-pay | Admitting: Emergency Medicine

## 2014-11-03 ENCOUNTER — Emergency Department (HOSPITAL_COMMUNITY): Payer: Non-veteran care

## 2014-11-03 DIAGNOSIS — S92515A Nondisplaced fracture of proximal phalanx of left lesser toe(s), initial encounter for closed fracture: Secondary | ICD-10-CM | POA: Insufficient documentation

## 2014-11-03 DIAGNOSIS — Y9389 Activity, other specified: Secondary | ICD-10-CM | POA: Insufficient documentation

## 2014-11-03 DIAGNOSIS — I2510 Atherosclerotic heart disease of native coronary artery without angina pectoris: Secondary | ICD-10-CM | POA: Insufficient documentation

## 2014-11-03 DIAGNOSIS — Y998 Other external cause status: Secondary | ICD-10-CM | POA: Insufficient documentation

## 2014-11-03 DIAGNOSIS — Y9289 Other specified places as the place of occurrence of the external cause: Secondary | ICD-10-CM | POA: Insufficient documentation

## 2014-11-03 DIAGNOSIS — I10 Essential (primary) hypertension: Secondary | ICD-10-CM | POA: Insufficient documentation

## 2014-11-03 DIAGNOSIS — S92912A Unspecified fracture of left toe(s), initial encounter for closed fracture: Secondary | ICD-10-CM

## 2014-11-03 DIAGNOSIS — W2203XA Walked into furniture, initial encounter: Secondary | ICD-10-CM | POA: Insufficient documentation

## 2014-11-03 IMAGING — DX DG TOE 5TH 2+V*L*
3 series · 3 of 3 positions shown · non-contrast
Comparison: None.

CLINICAL DATA: Pt dropped dresser on left 5th toe 2 days ago. Pt
has sharp pain and swelling in left 5th toe.

EXAM:
DG TOE 5TH LEFT

[toe ap]
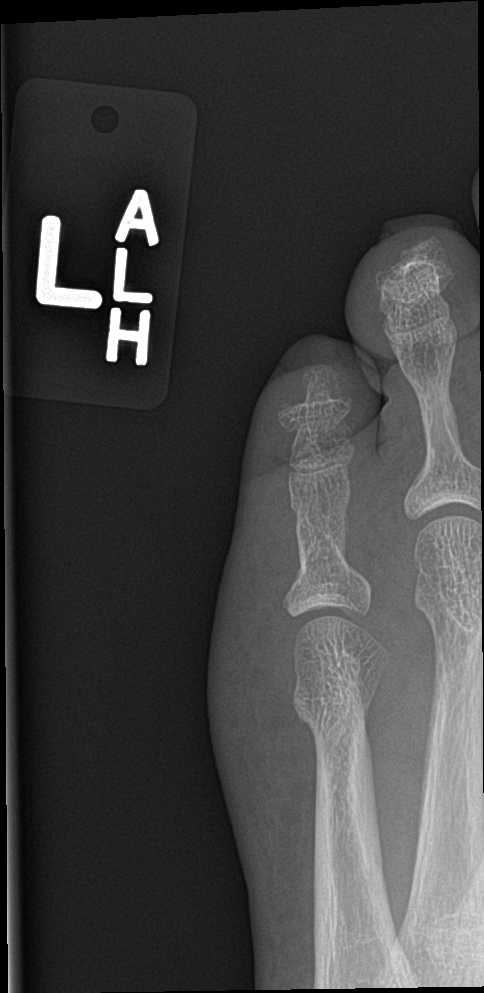

[toe obl]
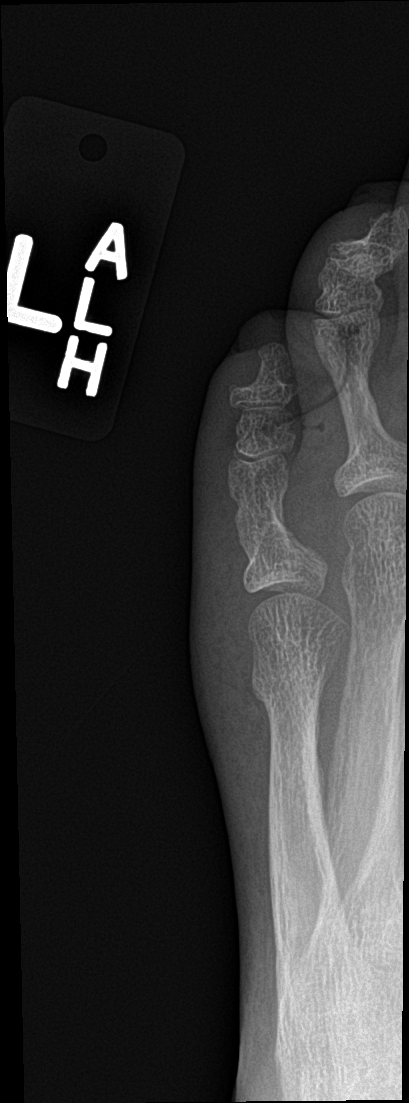

[toe lat]
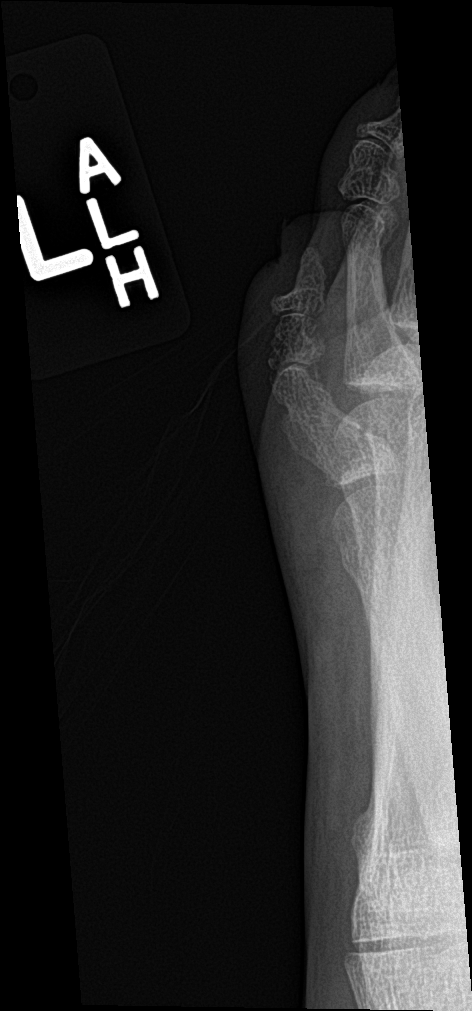

[3 of 3 positions shown; findings below may reference images not displayed]

FINDINGS: Subtle fracture of the base of the fifth toe proximal phalanx best
seen on the lateral image. Fracture is nondisplaced. There is also
some deformity of the midshaft of the proximal phalanx that suggests
an old, healed fracture.

No other fractures. Joints are normally aligned. There is
surrounding soft tissue swelling.
IMPRESSION: Subtle nondisplaced fracture of the proximal aspect of the proximal
phalanx of the left fifth toe.

## 2014-11-03 MED ORDER — HYDROCODONE-ACETAMINOPHEN 5-325 MG PO TABS: 1.0000 | ORAL_TABLET | Freq: Four times a day (QID) | ORAL | Status: DC | PRN

## 2014-11-03 MED ORDER — HYDROCODONE-ACETAMINOPHEN 5-325 MG PO TABS
1.0000 | ORAL_TABLET | Freq: Once | ORAL | Status: AC
  Administered 2014-11-03: 1 via ORAL
  Filled 2014-11-03: qty 1

## 2014-11-03 NOTE — ED Provider Notes (Signed)
CSN: [id number]     Arrival date & time 11/03/14  1235 History   First MD Initiated Contact with Patient 11/03/14 1301     Chief Complaint  Patient presents with  . Toe Injury     (Consider location/radiation/quality/duration/timing/severity/associated sxs/prior Treatment) Patient is a 69 y.o. male presenting with foot injury. The history is provided by the patient.  Foot Injury Location:  Toe Time since incident:  3 days Toe location:  L little toe Pain details:    Quality:  Sharp and throbbing   Radiates to:  Does not radiate   Severity:  Moderate   Onset quality:  Sudden   Progression:  Worsening Chronicity:  New Foreign body present:  No foreign bodies Prior injury to area:  No Relieved by:  None tried Worsened by:  Activity and bearing weight Ineffective treatments:  None tried  Stevin Bielinski is a 69 y.o. male with hx of CAD, HTN and diet controlled diabetes presents to the ED with left little toe pain. He states that he hit his toe on a piece of furniture 3 days ago. He wears steel toe boots at work and the pain has gotten worse. Patient has taken nothing for pain. He states that his doctor told him not to take ibuprofen due to HTN.   Past Medical History  Diagnosis Date  . Coronary artery disease   . Hypertension   . Heart disease    History reviewed. No pertinent past surgical history. No family history on file. Social History  Substance Use Topics  . Smoking status: Never Smoker   . Smokeless tobacco: None  . Alcohol Use: Yes    Review of Systems  Musculoskeletal:       Left toe pain  all other systems negative.    Allergies  Review of patient's allergies indicates no known allergies.  Home Medications   Prior to Admission medications   Medication Sig Start Date End Date Taking? Authorizing Provider  HYDROcodone-acetaminophen (NORCO) 5-325 MG per tablet Take 1 tablet by mouth every 6 (six) hours as needed. 11/03/14   Palmira Stickle Bunnie Pion, NP   BP  169/87 mmHg  Pulse 66  Temp(Src) 98.6 F (37 C) (Oral)  Resp 14  SpO2 100% Physical Exam  Constitutional: He is oriented to person, place, and time. He appears well-developed and well-nourished.  HENT:  Head: Normocephalic.  Eyes: EOM are normal.  Neck: Normal range of motion. Neck supple.  Cardiovascular: Normal rate.   Pulmonary/Chest: Effort normal.  Musculoskeletal:       Feet:  Left fifth toe with swelling. Tender to palpation and with range of motion. Pedal pulse 2+, adequate circulation, good touch sensation.   Neurological: He is alert and oriented to person, place, and time. No cranial nerve deficit.  Skin: Skin is warm and dry.  Psychiatric: He has a normal mood and affect. His behavior is normal.  Nursing note and vitals reviewed.   ED Course  Procedures (including critical care time) Labs Review Labs Reviewed - No data to display  Imaging Review Dg Toe 5th Left  11/03/2014   CLINICAL DATA:  Pt dropped dresser on left 5th toe 2 days ago. Pt has sharp pain and swelling in left 5th toe.  EXAM: DG TOE 5TH LEFT  COMPARISON:  None.  FINDINGS: Subtle fracture of the base of the fifth toe proximal phalanx best seen on the lateral image. Fracture is nondisplaced. There is also some deformity of the midshaft of the proximal phalanx  that suggests an old, healed fracture.  No other fractures. Joints are normally aligned. There is surrounding soft tissue swelling.  IMPRESSION: Subtle nondisplaced fracture of the proximal aspect of the proximal phalanx of the left fifth toe.   Electronically Signed   By: Lajean Manes M.D.   On: 11/03/2014 13:29   I, Allicia Culley, personally reviewed and evaluated these images results as part of my medical decision-making.   MDM  69 y.o. male with left little toe pain after hitting toe on furniture 3 days ago. Will treat fracture. Buddy tape of toes, post op shoe, ice, elevation and follow up with ortho. Discussed with the patient and all questioned  fully answered. He will return if any problems arise.   Final diagnoses:  Fracture of toe of left foot, closed, initial encounter       [facility], NP 11/03/14 [place], MD 11/03/14 1346

## 2014-11-03 NOTE — ED Notes (Signed)
Caught left 5th toe on furniture, 3 days ago. Has had increasing pain.

## 2015-07-26 ENCOUNTER — Emergency Department (HOSPITAL_COMMUNITY)
Admission: EM | Admit: 2015-07-26 | Discharge: 2015-07-27 | Disposition: A | Discharge status: Home / Self Care | Payer: No Typology Code available for payment source | Attending: Emergency Medicine | Admitting: Emergency Medicine

## 2015-07-26 ENCOUNTER — Encounter (HOSPITAL_COMMUNITY): Payer: Self-pay

## 2015-07-26 DIAGNOSIS — I10 Essential (primary) hypertension: Secondary | ICD-10-CM | POA: Insufficient documentation

## 2015-07-26 DIAGNOSIS — R1033 Periumbilical pain: Secondary | ICD-10-CM | POA: Insufficient documentation

## 2015-07-26 DIAGNOSIS — I2510 Atherosclerotic heart disease of native coronary artery without angina pectoris: Secondary | ICD-10-CM | POA: Insufficient documentation

## 2015-07-26 LAB — COMPREHENSIVE METABOLIC PANEL
ALBUMIN: 3.6 g/dL (ref 3.5–5.0)
ALT: 27 U/L (ref 17–63)
AST: 34 U/L (ref 15–41)
Alkaline Phosphatase: 52 U/L (ref 38–126)
Anion gap: 13 (ref 5–15)
BUN: 10 mg/dL (ref 6–20)
CALCIUM: 8.9 mg/dL (ref 8.9–10.3)
CO2: 19 mmol/L — AB (ref 22–32)
Chloride: 108 mmol/L (ref 101–111)
Creatinine, Ser: 0.9 mg/dL (ref 0.61–1.24)
GFR calc Af Amer: >60 mL/min (ref >60)
GFR calc non Af Amer: >60 mL/min (ref >60)
GLUCOSE: 116 mg/dL — AB (ref 65–99)
POTASSIUM: 3.8 mmol/L (ref 3.5–5.1)
SODIUM: 140 mmol/L (ref 135–145)
TOTAL PROTEIN: 7 g/dL (ref 6.5–8.1)
Total Bilirubin: 1.3 mg/dL — ABNORMAL HIGH (ref 0.3–1.2)

## 2015-07-26 LAB — URINALYSIS, ROUTINE W REFLEX MICROSCOPIC
BILIRUBIN URINE: NEGATIVE
Glucose, UA: NEGATIVE mg/dL
HGB URINE DIPSTICK: NEGATIVE
KETONES UR: NEGATIVE mg/dL
Leukocytes, UA: NEGATIVE
NITRITE: NEGATIVE
PH: 5.5 (ref 5.0–8.0)
Protein, ur: NEGATIVE mg/dL
SPECIFIC GRAVITY, URINE: 1.011 (ref 1.005–1.030)

## 2015-07-26 LAB — CBC
HEMATOCRIT: 41.9 % (ref 39.0–52.0)
HEMOGLOBIN: 14.1 g/dL (ref 13.0–17.0)
MCH: 33.4 pg (ref 26.0–34.0)
MCHC: 33.7 g/dL (ref 30.0–36.0)
MCV: 99.3 fL (ref 78.0–100.0)
Platelets: 144 10*3/uL — ABNORMAL LOW (ref 150–400)
RBC: 4.22 MIL/uL (ref 4.22–5.81)
RDW: 12.5 % (ref 11.5–15.5)
WBC: 5.3 10*3/uL (ref 4.0–10.5)

## 2015-07-26 LAB — LIPASE, BLOOD: LIPASE: 29 U/L (ref 11–51)

## 2015-07-26 NOTE — ED Notes (Signed)
Pt leaving advising he feels better.  This EMT advised him he needed to stay, but he stated he wanted to leave and if he felt better he would return to the ED.

## 2015-07-26 NOTE — ED Notes (Signed)
Pt here with c/o abdominal pain around umbilicus, onset this morning around 1030. Denies N/V/D and denies any other symptoms, just reports abdominal pain.

## 2016-01-29 ENCOUNTER — Emergency Department (HOSPITAL_COMMUNITY)
Admission: EM | Admit: 2016-01-29 | Discharge: 2016-01-29 | Disposition: A | Discharge status: Home / Self Care | Payer: Non-veteran care | Attending: Emergency Medicine | Admitting: Emergency Medicine

## 2016-01-29 ENCOUNTER — Encounter (HOSPITAL_COMMUNITY): Payer: Self-pay | Admitting: *Deleted

## 2016-01-29 ENCOUNTER — Emergency Department (HOSPITAL_BASED_OUTPATIENT_CLINIC_OR_DEPARTMENT_OTHER)
Admit: 2016-01-29 | Discharge: 2016-01-29 | Disposition: A | Discharge status: Home / Self Care | Payer: Non-veteran care | Attending: Emergency Medicine | Admitting: Emergency Medicine

## 2016-01-29 DIAGNOSIS — L03115 Cellulitis of right lower limb: Secondary | ICD-10-CM | POA: Diagnosis not present

## 2016-01-29 DIAGNOSIS — Z79899 Other long term (current) drug therapy: Secondary | ICD-10-CM | POA: Insufficient documentation

## 2016-01-29 DIAGNOSIS — M7989 Other specified soft tissue disorders: Secondary | ICD-10-CM

## 2016-01-29 DIAGNOSIS — M79609 Pain in unspecified limb: Secondary | ICD-10-CM | POA: Diagnosis not present

## 2016-01-29 DIAGNOSIS — Z7982 Long term (current) use of aspirin: Secondary | ICD-10-CM | POA: Insufficient documentation

## 2016-01-29 DIAGNOSIS — I10 Essential (primary) hypertension: Secondary | ICD-10-CM | POA: Diagnosis not present

## 2016-01-29 DIAGNOSIS — I2510 Atherosclerotic heart disease of native coronary artery without angina pectoris: Secondary | ICD-10-CM | POA: Diagnosis not present

## 2016-01-29 LAB — CBC WITH DIFFERENTIAL/PLATELET
BASOS PCT: 0 %
Basophils Absolute: 0 10*3/uL (ref 0.0–0.1)
EOS ABS: 0 10*3/uL (ref 0.0–0.7)
EOS PCT: 0 %
HCT: 43.7 % (ref 39.0–52.0)
Hemoglobin: 15.1 g/dL (ref 13.0–17.0)
LYMPHS ABS: 1.7 10*3/uL (ref 0.7–4.0)
Lymphocytes Relative: 30 %
MCH: 33.4 pg (ref 26.0–34.0)
MCHC: 34.6 g/dL (ref 30.0–36.0)
MCV: 96.7 fL (ref 78.0–100.0)
Monocytes Absolute: 0.4 10*3/uL (ref 0.1–1.0)
Monocytes Relative: 7 %
Neutro Abs: 3.5 10*3/uL (ref 1.7–7.7)
Neutrophils Relative %: 63 %
PLATELETS: 141 10*3/uL — AB (ref 150–400)
RBC: 4.52 MIL/uL (ref 4.22–5.81)
RDW: 12.7 % (ref 11.5–15.5)
WBC: 5.6 10*3/uL (ref 4.0–10.5)

## 2016-01-29 LAB — BASIC METABOLIC PANEL
Anion gap: 12 (ref 5–15)
BUN: 9 mg/dL (ref 6–20)
CO2: 21 mmol/L — ABNORMAL LOW (ref 22–32)
CREATININE: 0.87 mg/dL (ref 0.61–1.24)
Calcium: 8.9 mg/dL (ref 8.9–10.3)
Chloride: 103 mmol/L (ref 101–111)
Glucose, Bld: 96 mg/dL (ref 65–99)
POTASSIUM: 3.6 mmol/L (ref 3.5–5.1)
SODIUM: 136 mmol/L (ref 135–145)

## 2016-01-29 MED ORDER — SULFAMETHOXAZOLE-TRIMETHOPRIM 800-160 MG PO TABS: 1.0000 | ORAL_TABLET | Freq: Two times a day (BID) | ORAL | 0 refills | Status: AC

## 2016-01-29 NOTE — ED Notes (Signed)
Pt transported to vascular.  °

## 2016-01-29 NOTE — ED Triage Notes (Signed)
Pt reports swelling in his rt leg since Saturday.  Pt reports heal pain recently and also has a wound to his rt lower leg.

## 2016-01-29 NOTE — Progress Notes (Signed)
VASCULAR LAB PRELIMINARY  PRELIMINARY  PRELIMINARY  PRELIMINARY  Right lower extremity venous duplex completed.    Preliminary report:  Right:  No evidence of DVT, superficial thrombosis, or Baker's cyst.  Don Norman, RVS 01/29/2016, 6:14 PM

## 2016-01-29 NOTE — ED Provider Notes (Signed)
[facility] DEPT Provider Note   CSN: [id number] Arrival date & time: 01/29/16  1158     History   Chief Complaint Chief Complaint  Patient presents with  . Leg Swelling    HPI Don Norman is a 70 y.o. male.  Patient presents with several days of right leg throbbing and mild swelling. Had an injury over a week ago in which he sustained a contusion to his anterior shin. No recent surgeries, no family or personal history of VTE. Has been able to ambulate.    The history is provided by the patient. No language interpreter was used.  Illness  This is a new problem. The current episode started more than 2 days ago. The problem occurs constantly. The problem has not changed since onset.Pertinent negatives include no chest pain, no abdominal pain and no shortness of breath. Exacerbated by: Palpation. Nothing relieves the symptoms. He has tried nothing for the symptoms. The treatment provided no relief.    Past Medical History:  Diagnosis Date  . Coronary artery disease   . Heart disease   . Hypertension     There are no active problems to display for this patient.   History reviewed. No pertinent surgical history.     Home Medications    Prior to Admission medications   Medication Sig Start Date End Date Taking? Authorizing Provider  aspirin EC 81 MG tablet Take 81 mg by mouth daily.   Yes Historical Provider, MD  sulfamethoxazole-trimethoprim (BACTRIM DS,SEPTRA DS) 800-160 MG tablet Take 1 tablet by mouth 2 (two) times daily. 01/29/16 02/08/16  Harlin Heys, MD    Family History No family history on file.  Social History Social History  Substance Use Topics  . Smoking status: Never Smoker  . Smokeless tobacco: Not on file  . Alcohol use Yes     Allergies   Patient has no known allergies.   Review of Systems Review of Systems  Constitutional: Negative for fever.  HENT: Negative.   Respiratory: Negative for shortness of breath.     Cardiovascular: Positive for leg swelling. Negative for chest pain.  Gastrointestinal: Negative for abdominal pain.  Genitourinary: Negative.   Musculoskeletal: Negative.   Skin: Positive for color change and wound.  Neurological: Negative.   Hematological: Negative.   Psychiatric/Behavioral: Negative.      Physical Exam Updated Vital Signs BP (!) 162/103   Pulse 74   Temp 98.5 F (36.9 C) (Oral)   Resp 16   SpO2 93%   Physical Exam  Constitutional: He is oriented to person, place, and time. He appears well-developed and well-nourished. No distress.  HENT:  Head: Normocephalic and atraumatic.  Eyes: Conjunctivae and EOM are normal. No scleral icterus.  Neck: Normal range of motion. Neck supple.  Cardiovascular: Normal rate, regular rhythm and intact distal pulses.   Pulmonary/Chest: Effort normal. No respiratory distress.  Musculoskeletal:  Trace edema to right leg. Scabbed wound over anterior shin. Mild surrounding erythema and warmth. No fluctuance or induration. 2+ DP pulse.  Neurological: He is alert and oriented to person, place, and time.  Skin: Skin is warm and dry. He is not diaphoretic.  Psychiatric: He has a normal mood and affect. His behavior is normal. Judgment and thought content normal.     ED Treatments / Results  Labs (all labs ordered are listed, but only abnormal results are displayed) Labs Reviewed  CBC WITH DIFFERENTIAL/PLATELET - Abnormal; Notable for the following:       Result Value  Platelets 141 (*)    All other components within normal limits  BASIC METABOLIC PANEL - Abnormal; Notable for the following:    CO2 21 (*)    All other components within normal limits    EKG  EKG Interpretation None       Radiology No results found.  Procedures Procedures (including critical care time)  Medications Ordered in ED Medications - No data to display   Initial Impression / Assessment and Plan / ED Course  I have reviewed the triage  vital signs and the nursing notes.  Pertinent labs & imaging results that were available during my care of the patient were reviewed by me and considered in my medical decision making (see chart for details).  Clinical Course     Patient presents with several days of swelling and throbbing pain to his right shin after having sustained an injury to the leg over a week ago. Denies chest pain or dyspnea, he is well-appearing and ambulatory. Do not suspect fracture. Extremity is warm and well-perfused. Duplex ultrasound negative for DVT. Suspect symptoms due to mild cellulitis. He will be started on Bactrim and was given return precautions for worsening symptoms. He has follow up already arranged with his PCP at the [place]. He expressed understanding of instructions and is in good condition for discharge home.  Final Clinical Impressions(s) / ED Diagnoses   Final diagnoses:  Cellulitis of right anterior lower leg    New Prescriptions Discharge Medication List as of 01/29/2016  6:58 PM    START taking these medications   Details  sulfamethoxazole-trimethoprim (BACTRIM DS,SEPTRA DS) 800-160 MG tablet Take 1 tablet by mouth 2 (two) times daily., Starting Tue 01/29/2016, Until Fri 02/08/2016, Print         Harlin Heys, MD 01/30/16 [place], MD 01/31/16 1320

## 2016-01-29 NOTE — ED Notes (Signed)
Contacted by vascular tech, pt negative for DVT

## 2016-06-15 ENCOUNTER — Emergency Department (HOSPITAL_COMMUNITY)
Admission: EM | Admit: 2016-06-15 | Discharge: 2016-06-15 | Disposition: A | Discharge status: Home / Self Care | Payer: Non-veteran care | Attending: Emergency Medicine | Admitting: Emergency Medicine

## 2016-06-15 ENCOUNTER — Encounter (HOSPITAL_COMMUNITY): Payer: Self-pay

## 2016-06-15 DIAGNOSIS — I2510 Atherosclerotic heart disease of native coronary artery without angina pectoris: Secondary | ICD-10-CM | POA: Insufficient documentation

## 2016-06-15 DIAGNOSIS — Z79899 Other long term (current) drug therapy: Secondary | ICD-10-CM | POA: Diagnosis not present

## 2016-06-15 DIAGNOSIS — M25471 Effusion, right ankle: Secondary | ICD-10-CM | POA: Insufficient documentation

## 2016-06-15 DIAGNOSIS — Z7982 Long term (current) use of aspirin: Secondary | ICD-10-CM | POA: Diagnosis not present

## 2016-06-15 DIAGNOSIS — I10 Essential (primary) hypertension: Secondary | ICD-10-CM | POA: Insufficient documentation

## 2016-06-15 NOTE — ED Triage Notes (Signed)
Pt complaining of R ankle pain and swelling. Pt denies any injury/trauma. Pt ambulatory at triage.

## 2016-06-15 NOTE — ED Provider Notes (Signed)
[facility] DEPT Provider Note   CSN: [id number] Arrival date & time: 06/15/16  9417   By signing my name below, I, Soijett Blue, attest that this documentation has been prepared under the direction and in the presence of Shary Decamp, PA-C Electronically Signed: Soijett Blue, ED Scribe. 06/15/16. 8:13 PM.  History   Chief Complaint Chief Complaint  Patient presents with  . Joint Swelling    HPI Don Norman is a 71 y.o. male with a PMHx of coronary artery dx, heart dx, HTN, DM, who presents to the Emergency Department complaining of right ankle swelling onset today. Pt reports associated right foot swelling. Pt has not tried any medications for the relief of his symptoms. He states that he takes many HTN medications, with one of them being amlodipine. Pt PCP is at the [facility] with his next appointment being I June 2018. Denies right ankle pain, fever, chills, numbness, tingling, calf pain, recent injury, recent trauma, and any other symptoms. Denies PMHx of gout.   The history is provided by the patient. No language interpreter was used.    Past Medical History:  Diagnosis Date  . Coronary artery disease   . Heart disease   . Hypertension     There are no active problems to display for this patient.   History reviewed. No pertinent surgical history.     Home Medications    Prior to Admission medications   Medication Sig Start Date End Date Taking? Authorizing Provider  aspirin EC 81 MG tablet Take 81 mg by mouth daily.    Historical Provider, MD    Family History History reviewed. No pertinent family history.  Social History Social History  Substance Use Topics  . Smoking status: Never Smoker  . Smokeless tobacco: Never Used  . Alcohol use Yes     Allergies   Patient has no known allergies.   Review of Systems Review of Systems  Constitutional: Negative for chills and fever.  Musculoskeletal: Positive for joint swelling (right ankle and right  foot). Negative for arthralgias and myalgias.  Neurological: Negative for numbness.       No tingling     Physical Exam Updated Vital Signs BP 125/66 (BP Location: Left Arm)   Pulse 72   Temp 97.8 F (36.6 C) (Oral)   Resp 20   SpO2 100%   Physical Exam  Constitutional: He is oriented to person, place, and time. He appears well-developed and well-nourished. No distress.  HENT:  Head: Normocephalic and atraumatic.  Eyes: EOM are normal.  Neck: Neck supple.  Cardiovascular: Normal rate.   Pulmonary/Chest: Effort normal. No respiratory distress.  Abdominal: He exhibits no distension.  Musculoskeletal: Normal range of motion.       Right ankle: He exhibits swelling. He exhibits normal range of motion. No tenderness.       Right lower leg: He exhibits no tenderness.  Right ankle with mild swelling noted. No pitting edema. No erythema. Non-TTP. FROM intact. Distal pulses appreciated. No calf tenderness. Negative homans sign.   Neurological: He is alert and oriented to person, place, and time.  Skin: Skin is warm and dry.  Psychiatric: He has a normal mood and affect. His behavior is normal.  Nursing note and vitals reviewed.  ED Treatments / Results  DIAGNOSTIC STUDIES: Oxygen Saturation is 100% on RA, nl by my interpretation.    COORDINATION OF CARE: 8:13 PM Discussed treatment plan with pt at bedside which includes consult with attending, and pt agreed  to plan.   Labs (all labs ordered are listed, but only abnormal results are displayed) Labs Reviewed - No data to display  EKG  EKG Interpretation None       Radiology No results found.  Procedures Procedures (including critical care time)  Medications Ordered in ED Medications - No data to display   Initial Impression / Assessment and Plan / ED Course  I have reviewed the triage vital signs and the nursing notes.  Pertinent labs & imaging results that were available during my care of the patient were  reviewed by me and considered in my medical decision making (see chart for details).     {I have reviewed the relevant previous healthcare records.  {I obtained HPI from historian.   ED Course:  Assessment: Pt is a 71 y.o. male with hx HTN, DM who presents with right ankles swelling. No trauma per patient. Just noticed swelling. No pain with ROM. No fevers. No numbness/tingling. No hx Gout. On exam, pt in NAD. Nontoxic/nonseptic appearing. VSS. Afebrile. Area of concern with mild swelling. No pitting edema. Discussed with supervising physician. Possibility that Amlodipine causing symptoms, but patient has been compliant on medication for several years. Possible musculoskeletal etiology. Counseled to elevate and Ice. Follow up with PCP. Plan is to [place]. At time of discharge, Patient is in no acute distress. Vital Signs are stable. Patient is able to ambulate. Patient able to tolerate PO.   Disposition/Plan:  DC Home Additional Verbal discharge instructions given and discussed with patient.  Pt Instructed to f/u with PCP in the next week for evaluation and treatment of symptoms. Return precautions given Pt acknowledges and agrees with plan  Supervising Physician Duffy Bruce, MD  Final Clinical Impressions(s) / ED Diagnoses   Final diagnoses:  Right ankle swelling    New Prescriptions New Prescriptions   No medications on file   I personally performed the services described in this documentation, which was scribed in my presence. The recorded information has been reviewed and is accurate.     Shary Decamp, PA-C 06/15/16 2026    Duffy Bruce, MD 06/16/16 2049

## 2016-06-15 NOTE — ED Notes (Signed)
E-signature unavailable, pt denies any concerns with dc

## 2016-06-15 NOTE — Discharge Instructions (Signed)
Please read and follow all provided instructions.  Your diagnoses today include:  1. Right ankle swelling     Tests performed today include: Vital signs. See below for your results today.   Medications prescribed:  Take as prescribed   Home care instructions:  Follow any educational materials contained in this packet.  Follow-up instructions: Please follow-up with your primary care provider for further evaluation of symptoms and treatment   Return instructions:  Please return to the Emergency Department if you do not get better, if you get worse, or new symptoms OR  - Fever (temperature greater than 101.26F)  - Bleeding that does not stop with holding pressure to the area    -Severe pain (please note that you may be more sore the day after your accident)  - Chest Pain  - Difficulty breathing  - Severe nausea or vomiting  - Inability to tolerate food and liquids  - Passing out  - Skin becoming red around your wounds  - Change in mental status (confusion or lethargy)  - New numbness or weakness    Please return if you have any other emergent concerns.  Additional Information:  Your vital signs today were: BP 125/66 (BP Location: Left Arm)    Pulse 72    Temp 97.8 F (36.6 C) (Oral)    Resp 20    SpO2 100%  If your blood pressure (BP) was elevated above 135/85 this visit, please have this repeated by your doctor within one month. ---------------

## 2016-06-15 NOTE — ED Notes (Signed)
See edp assessment 

## 2016-08-14 ENCOUNTER — Encounter (HOSPITAL_COMMUNITY): Payer: Self-pay | Admitting: *Deleted

## 2016-08-14 ENCOUNTER — Emergency Department (HOSPITAL_COMMUNITY)
Admission: EM | Admit: 2016-08-14 | Discharge: 2016-08-14 | Disposition: A | Discharge status: Home / Self Care | Payer: Non-veteran care | Attending: Emergency Medicine | Admitting: Emergency Medicine

## 2016-08-14 ENCOUNTER — Emergency Department (HOSPITAL_COMMUNITY): Payer: Non-veteran care

## 2016-08-14 DIAGNOSIS — R945 Abnormal results of liver function studies: Secondary | ICD-10-CM | POA: Diagnosis not present

## 2016-08-14 DIAGNOSIS — I10 Essential (primary) hypertension: Secondary | ICD-10-CM | POA: Diagnosis not present

## 2016-08-14 DIAGNOSIS — F1010 Alcohol abuse, uncomplicated: Secondary | ICD-10-CM | POA: Insufficient documentation

## 2016-08-14 DIAGNOSIS — F1090 Alcohol use, unspecified, uncomplicated: Secondary | ICD-10-CM

## 2016-08-14 DIAGNOSIS — R1013 Epigastric pain: Secondary | ICD-10-CM | POA: Diagnosis not present

## 2016-08-14 DIAGNOSIS — Z7289 Other problems related to lifestyle: Secondary | ICD-10-CM

## 2016-08-14 DIAGNOSIS — Z7982 Long term (current) use of aspirin: Secondary | ICD-10-CM | POA: Insufficient documentation

## 2016-08-14 DIAGNOSIS — Z87891 Personal history of nicotine dependence: Secondary | ICD-10-CM | POA: Insufficient documentation

## 2016-08-14 DIAGNOSIS — R748 Abnormal levels of other serum enzymes: Secondary | ICD-10-CM

## 2016-08-14 LAB — CBC
HCT: 47.1 % (ref 39.0–52.0)
HEMOGLOBIN: 16.8 g/dL (ref 13.0–17.0)
MCH: 33.9 pg (ref 26.0–34.0)
MCHC: 35.7 g/dL (ref 30.0–36.0)
MCV: 95.2 fL (ref 78.0–100.0)
PLATELETS: 119 10*3/uL — AB (ref 150–400)
RBC: 4.95 MIL/uL (ref 4.22–5.81)
RDW: 12.4 % (ref 11.5–15.5)
WBC: 5.4 10*3/uL (ref 4.0–10.5)

## 2016-08-14 LAB — URINALYSIS, ROUTINE W REFLEX MICROSCOPIC
Bilirubin Urine: NEGATIVE
Glucose, UA: NEGATIVE mg/dL
HGB URINE DIPSTICK: NEGATIVE
Ketones, ur: 5 mg/dL — AB
Leukocytes, UA: NEGATIVE
NITRITE: NEGATIVE
PROTEIN: NEGATIVE mg/dL
Specific Gravity, Urine: 1.024 (ref 1.005–1.030)
pH: 5 (ref 5.0–8.0)

## 2016-08-14 LAB — COMPREHENSIVE METABOLIC PANEL
ALK PHOS: 82 U/L (ref 38–126)
ALT: 108 U/L — AB (ref 17–63)
ANION GAP: 12 (ref 5–15)
AST: 297 U/L — ABNORMAL HIGH (ref 15–41)
Albumin: 3.8 g/dL (ref 3.5–5.0)
BILIRUBIN TOTAL: 2.6 mg/dL — AB (ref 0.3–1.2)
BUN: 17 mg/dL (ref 6–20)
CALCIUM: 8.4 mg/dL — AB (ref 8.9–10.3)
CO2: 19 mmol/L — AB (ref 22–32)
CREATININE: 1.13 mg/dL (ref 0.61–1.24)
Chloride: 103 mmol/L (ref 101–111)
GFR calc non Af Amer: >60 mL/min (ref >60)
Glucose, Bld: 96 mg/dL (ref 65–99)
Potassium: 5.3 mmol/L — ABNORMAL HIGH (ref 3.5–5.1)
SODIUM: 134 mmol/L — AB (ref 135–145)
TOTAL PROTEIN: 7.2 g/dL (ref 6.5–8.1)

## 2016-08-14 LAB — I-STAT TROPONIN, ED
TROPONIN I, POC: 0.01 ng/mL (ref 0.00–0.08)
Troponin i, poc: 0.01 ng/mL (ref 0.00–0.08)

## 2016-08-14 LAB — LIPASE, BLOOD: Lipase: 37 U/L (ref 11–51)

## 2016-08-14 IMAGING — DX DG CHEST 2V
2 series · 2 of 2 positions shown · non-contrast
Comparison: [DATE].

CLINICAL DATA: Chest and abdomen pain for 5 days.

EXAM:
CHEST  2 VIEW

[w chest pa]
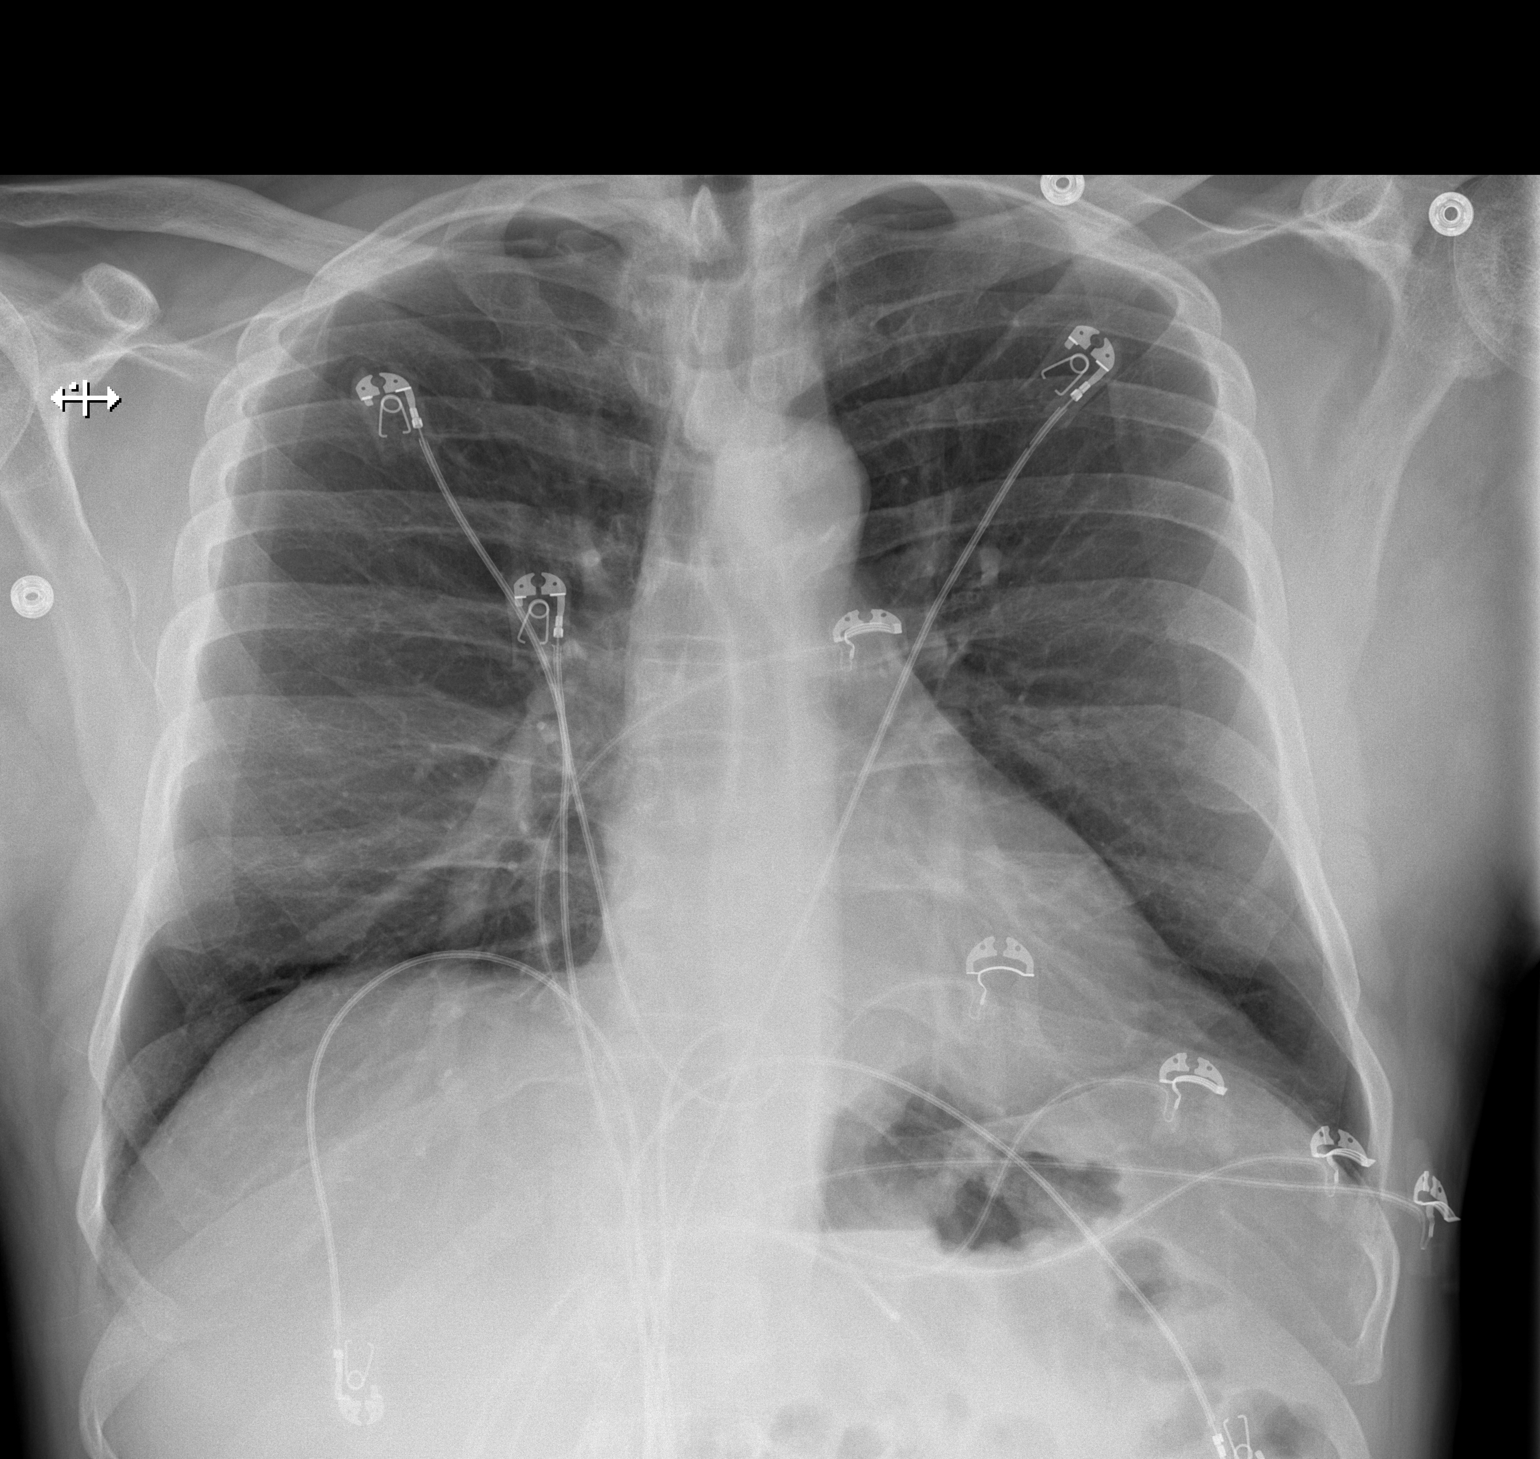

[w chest lat]
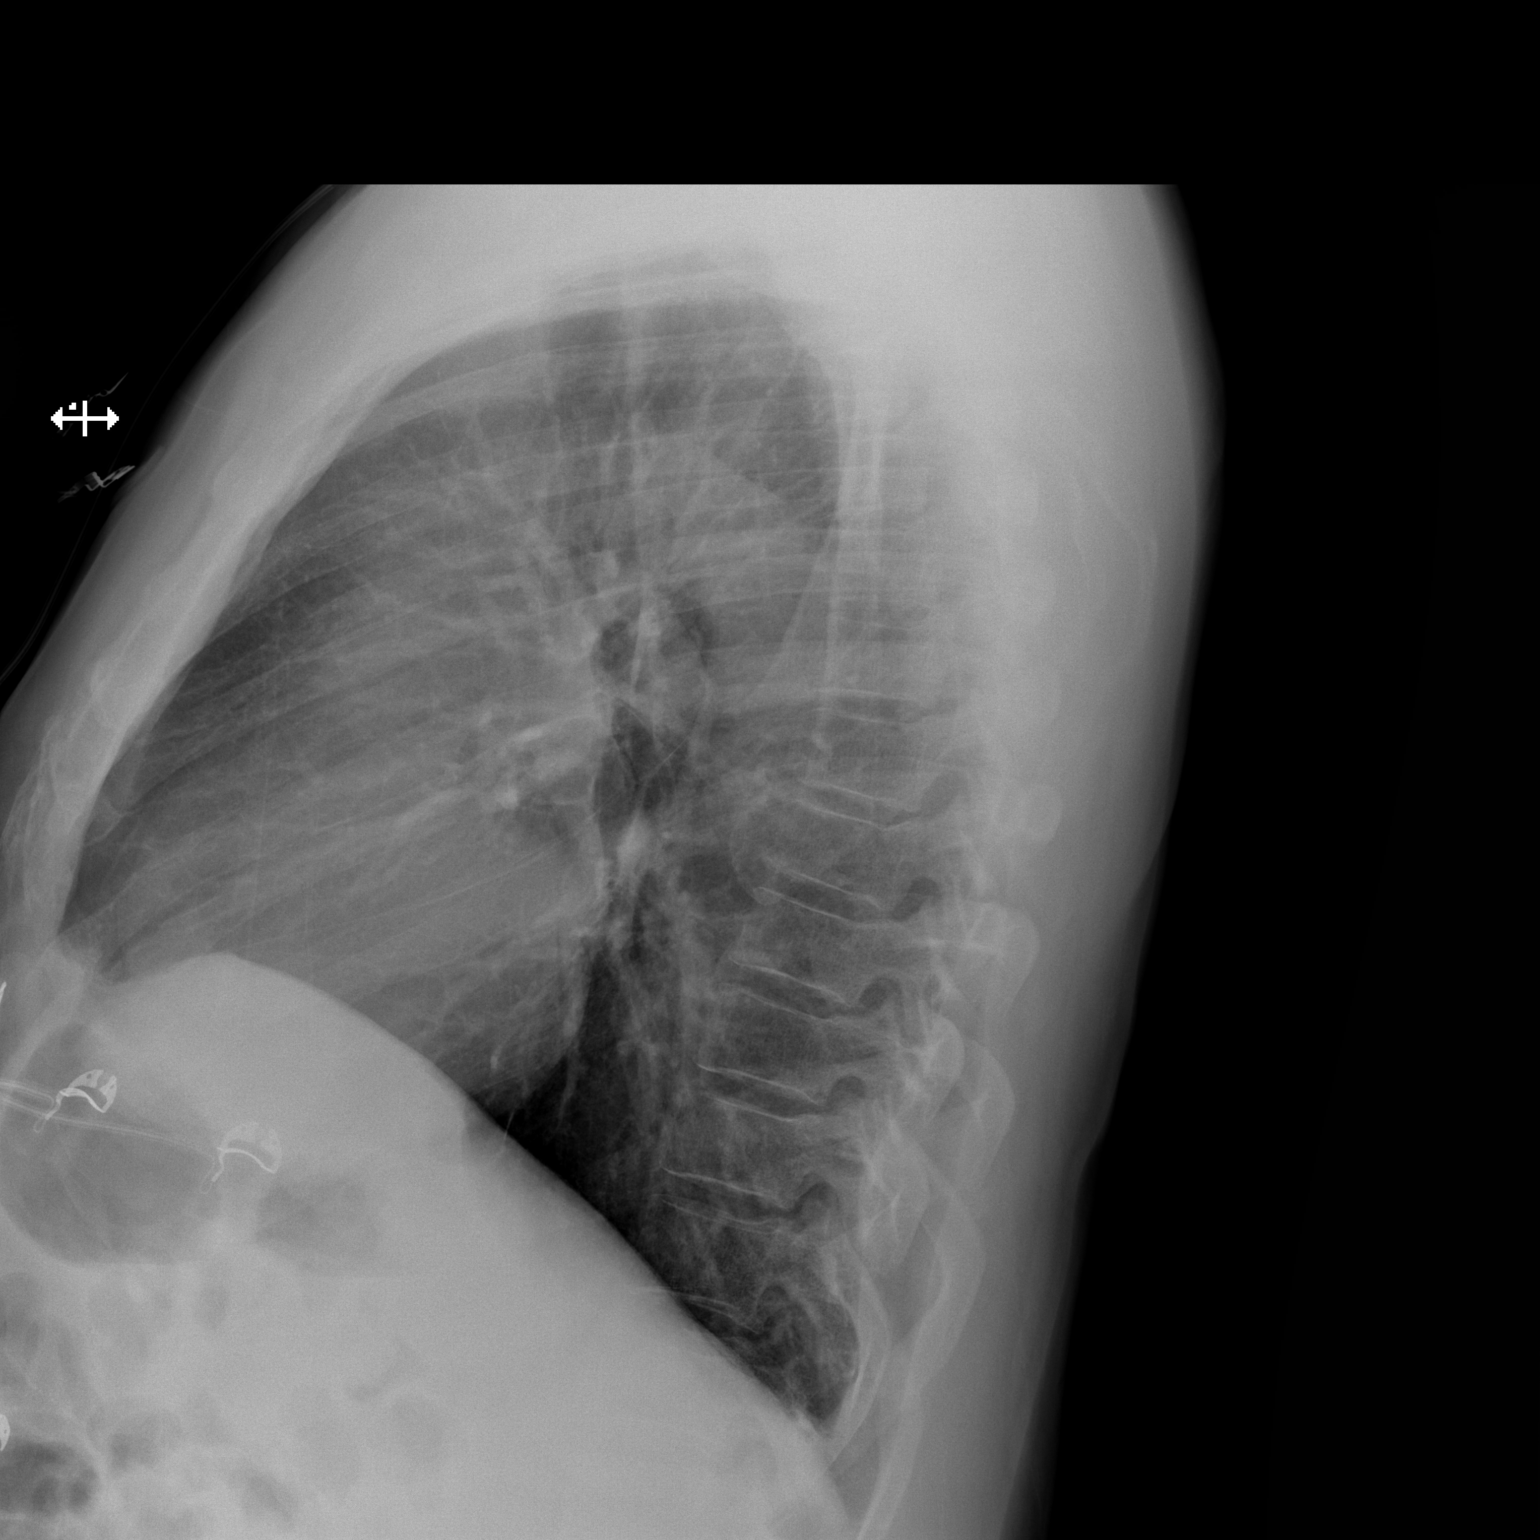

[2 of 2 positions shown; findings below may reference images not displayed]

FINDINGS: The heart size and mediastinal contours are within normal limits.
Both lungs are clear. The visualized skeletal structures are
unremarkable.
IMPRESSION: No active cardiopulmonary disease.  Stable appearance from priors.

## 2016-08-14 MED ORDER — ONDANSETRON 4 MG PO TBDP: 4.0000 mg | ORAL_TABLET | Freq: Three times a day (TID) | ORAL | 0 refills | Status: DC | PRN

## 2016-08-14 MED ORDER — GI COCKTAIL ~~LOC~~
30.0000 mL | Freq: Once | ORAL | Status: AC
  Administered 2016-08-14: 30 mL via ORAL
  Filled 2016-08-14: qty 30

## 2016-08-14 MED ORDER — OMEPRAZOLE 20 MG PO CPDR
20.0000 mg | DELAYED_RELEASE_CAPSULE | Freq: Every day | ORAL | 1 refills | Status: DC
Start: 2016-08-14 — End: 2020-11-01

## 2016-08-14 MED ORDER — ASPIRIN 81 MG PO CHEW
324.0000 mg | CHEWABLE_TABLET | Freq: Once | ORAL | Status: AC
  Administered 2016-08-14: 324 mg via ORAL
  Filled 2016-08-14: qty 4

## 2016-08-14 MED ORDER — SUCRALFATE 1 G PO TABS: 1.0000 g | ORAL_TABLET | Freq: Three times a day (TID) | ORAL | 0 refills | Status: DC

## 2016-08-14 NOTE — ED Triage Notes (Signed)
C/o "funny feeling in his stomach " last Friday , states he doesn't have pain . Several episodes of vomiting this last week and states he has at least 2 bowel movements a day they are loose.

## 2016-08-14 NOTE — ED Notes (Signed)
Pt attempting UA sample

## 2016-08-14 NOTE — ED Notes (Signed)
Assisted up to [facility] states his stomach isn't feeling right .

## 2016-08-14 NOTE — ED Provider Notes (Signed)
[facility] DEPT Provider Note   CSN: [id number] Arrival date & time: 08/14/16  3559     History   Chief Complaint Chief Complaint  Patient presents with  . Abdominal Pain    HPI Mccormick Macon is a 71 y.o. male.  Patient with history of hypertension, hyperlipidemia, borderline diabetes, remote smoker, gastroesophageal reflux -- presents "I just haven't felt good since last Friday". Patient does not describe any discrete chest or abdominal pain but states "something just doesn't feel right" in his chest or abdomen. No associated shortness of breath. He had an episode of vomiting yesterday. Sensation waxes and wanes. It is not associated with activity. No lower extremity swelling or pain. No fevers or cough. The onset of this condition was acute. No history of abdominal surgeries. Patient does admit to drinking alcohol daily. He states that recently he has been trying to cut back stating he drank 2 beers per day earlier this week.        Past Medical History:  Diagnosis Date  . Heart disease   . Hypertension     There are no active problems to display for this patient.   History reviewed. No pertinent surgical history.     Home Medications    Prior to Admission medications   Medication Sig Start Date End Date Taking? Authorizing Provider  aspirin EC 81 MG tablet Take 81 mg by mouth daily.    [provider]    Family History No family history on file.  Social History Social History  Substance Use Topics  . Smoking status: Former Research scientist (life sciences)  . Smokeless tobacco: Never Used  . Alcohol use Yes     Allergies   Patient has no known allergies.   Review of Systems Review of Systems  Constitutional: Negative for diaphoresis and fever.  Eyes: Negative for redness.  Respiratory: Negative for cough and shortness of breath.   Cardiovascular: Negative for chest pain, palpitations and leg swelling.  Gastrointestinal: Negative for abdominal pain,  nausea and vomiting.  Genitourinary: Negative for dysuria.  Musculoskeletal: Negative for back pain and neck pain.  Skin: Negative for rash.  Neurological: Negative for syncope and light-headedness.  Psychiatric/Behavioral: The patient is not nervous/anxious.      Physical Exam Updated Vital Signs BP (!) 132/92 (BP Location: Right Arm)   Pulse 92   Temp 98.6 F (37 C) (Oral)   Resp 20   Ht 5' 11.5" (1.816 m)   Wt 95.3 kg (210 lb)   SpO2 99%   BMI 28.88 kg/m   Physical Exam  Constitutional: He appears well-developed and well-nourished. He appears distressed (Slightly uncomfortable appearing).  HENT:  Head: Normocephalic and atraumatic.  Mouth/Throat: Oropharynx is clear and moist.  Eyes: Conjunctivae are normal. Right eye exhibits no discharge. Left eye exhibits no discharge.  Neck: Normal range of motion. Neck supple.  Cardiovascular: Normal rate, regular rhythm and normal heart sounds.   No murmur heard. Pulmonary/Chest: Effort normal and breath sounds normal. No respiratory distress. He has no wheezes. He has no rales.  Abdominal: Soft. There is no tenderness. There is no rebound and no guarding.  Musculoskeletal: He exhibits no edema.  No lower extremity edema  Neurological: He is alert.  Skin: Skin is warm and dry.  Psychiatric: He has a normal mood and affect.  Nursing note and vitals reviewed.    ED Treatments / Results  Labs (all labs ordered are listed, but only abnormal results are displayed) Labs Reviewed  COMPREHENSIVE METABOLIC PANEL -  Abnormal; Notable for the following:       Result Value   Sodium 134 (*)    Potassium 5.3 (*)    CO2 19 (*)    Calcium 8.4 (*)    AST 297 (*)    ALT 108 (*)    Total Bilirubin 2.6 (*)    All other components within normal limits  CBC - Abnormal; Notable for the following:    Platelets 119 (*)    All other components within normal limits  URINALYSIS, ROUTINE W REFLEX MICROSCOPIC - Abnormal; Notable for the  following:    Ketones, ur 5 (*)    All other components within normal limits  LIPASE, BLOOD  I-STAT TROPOININ, ED  I-STAT TROPOININ, ED  I-STAT TROPOININ, ED    EKG  EKG Interpretation  Date/Time:  Thursday Aug 14 2016 09:05:27 EDT Ventricular Rate:  80 PR Interval:    QRS Duration: 150 QT Interval:  478 QTC Calculation: 552 R Axis:   -95 Text Interpretation:  Sinus rhythm Nonspecific IVCD with LAD Morphology of S wavd V5 changed from prior, otherwise no significant change Confirmed by [facility] MD, ERIN ([id number]) on 08/14/2016 9:13:00 AM       Radiology Dg Chest 2 View  Result Date: 08/14/2016 CLINICAL DATA:  Chest and abdomen pain for 5 days. EXAM: CHEST  2 VIEW COMPARISON:  07/08/2012. FINDINGS: The heart size and mediastinal contours are within normal limits. Both lungs are clear. The visualized skeletal structures are unremarkable. IMPRESSION: No active cardiopulmonary disease.  Stable appearance from priors. Electronically Signed   By: Staci Righter M.D.   On: 08/14/2016 10:03    Procedures Procedures (including critical care time)  Medications Ordered in ED Medications  aspirin chewable tablet 324 mg (324 mg Oral Given 08/14/16 0936)  gi cocktail (Maalox,Lidocaine,Donnatal) (30 mLs Oral Given 08/14/16 0936)     Initial Impression / Assessment and Plan / ED Course  I have reviewed the triage vital signs and the nursing notes.  Pertinent labs & imaging results that were available during my care of the patient were reviewed by me and considered in my medical decision making (see chart for details).     Patient seen and examined. EKG reviewed with Dr. Billy Fischer. Does not appear significantly changed from previous. Work-up initiated. Medications ordered.   Vital signs reviewed and are as follows: BP (!) 132/92 (BP Location: Right Arm)   Pulse 92   Temp 98.6 F (37 C) (Oral)   Resp 20   Ht 5' 11.5" (1.816 m)   Wt 95.3 kg (210 lb)   SpO2 99%   BMI 28.88 kg/m      11:34 AM Patient rechecked. Improved with GI cocktail. Informed of results to this point. Will perform delta troponin to ensure no active ischemia. If negative, suspect patient can be discharged home with medication for GI etiology. Counseled on alcohol cessation.  1:10 PM repeat troponin and EKG are stable. Patient informed. Encouraged PCP follow-up. Return to the emergency department with development of chest pain, abdominal pain, vomiting. Patient also encouraged to decrease alcohol use and follow-up with his primary care physician regarding his elevated liver enzymes.  Records reviewed:   Cath 09/2008:  FINDINGS:  1. Left main artery - branches into the circumflex, ramus, and left anterior descending artery.  This is a left dominant system. The left anterior descending artery is a large vessel giving rise to 1 moderate-size diagonal branch, which has 20% ostial stenosis.  The LAD then continues  to wrap around the apex without any significant disease. The circumflex branch is large and functions as the posterior descending artery.  There is minor 20% ostial stenosis. There is also a large ramus branch encompassing much of the lateral territory, which has approximately 20% ostial stenosis.  The right coronary artery also gives rise to an arterial division, which encompasses the inferior wall, it is moderate in size with no angiographically significant coronary artery disease.   1. Left ventriculogram - ejection fraction is reduced in the 30-40% range.  Quantification will be performed.  Left ventricular systolic pressure is 683 with an end-diastolic pressure of 11 mmHg. Aortic pressure is 158/72, with a mean of 102 mmHg.  There is no evidence of aortic stenosis.  There is no systolic mitral regurgitation that is significant appreciated on left ventriculogram.  The left ventricle is also mildly dilated. Quantification of the left ventricle demonstrates an ejection fraction of 37%.   IMPRESSION:   1. Minor irregularities throughout coronary arteries, most specifically ostial circumflex and ostial ramus branch, but no hemodynamically significant coronary artery disease present.  2. Moderately reduced left ventricular ejection fraction with quantification of ejection fraction of 37%.  Global hypokinesis, most notable in the anteroapical region.  3. Hypertension.  Final Clinical Impressions(s) / ED Diagnoses   Final diagnoses:  Dyspepsia  Elevated liver enzymes  Chronic alcohol use   Patient with vague feelings of chest and abdominal discomfort. No focal pain on exam. Workup here demonstrates transaminitis, likely related to daily prolonged alcohol use. Exam during emergency department is stable. Troponin negative 2 and EKG without any changes. Pain improved with GI cocktail. Low suspicion for ACS, PE. Patient started on omeprazole and Carafate. Encouraged PCP follow-up as above. Return instructions as above.   New Prescriptions New Prescriptions   OMEPRAZOLE (PRILOSEC) 20 MG CAPSULE    Take 1 capsule (20 mg total) by mouth daily.   SUCRALFATE (CARAFATE) 1 G TABLET    Take 1 tablet (1 g total) by mouth 4 (four) times daily -  with meals and at bedtime.     Carlisle Cater, PA-C 08/14/16 1312    Gareth Morgan, MD 08/15/16 1140

## 2016-08-14 NOTE — ED Notes (Signed)
Small soft formed stool in bedpan . Assisted back to bed.

## 2016-08-14 NOTE — Discharge Instructions (Signed)
Please read and follow all provided instructions.  Your diagnoses today include:  1. Dyspepsia   2. Elevated liver enzymes   3. Chronic alcohol use     Tests performed today include:  An EKG of your heart  A chest x-ray  Cardiac enzymes - a blood test for heart muscle damage  Blood counts and electrolytes - shows elevated liver tests probably due to alcohol  Vital signs. See below for your results today.   Medications prescribed:   Omeprazole (Prilosec) - stomach acid reducer  This medication can be found over-the-counter   Carafate - for stomach upset and to protect your stomach  Take any prescribed medications only as directed.  Follow-up instructions: Please follow-up with your primary care provider as soon as you can for further evaluation of your symptoms.   Return instructions:  SEEK IMMEDIATE MEDICAL ATTENTION IF:  You have severe chest pain, especially if the pain is crushing or pressure-like and spreads to the arms, back, neck, or jaw, or if you have sweating, nausea (feeling sick to your stomach), or shortness of breath. THIS IS AN EMERGENCY. Don't wait to see if the pain will go away. Get medical help at once. Call 911 or 0 (operator). DO NOT drive yourself to the hospital.   Your chest pain gets worse and does not go away with rest.   You have an attack of chest pain lasting longer than usual, despite rest and treatment with the medications your caregiver has prescribed.   You wake from sleep with chest pain or shortness of breath.  You feel dizzy or faint.  You have chest pain not typical of your usual pain for which you originally saw your caregiver.   You have any other emergent concerns regarding your health.  Additional Information: Chest pain comes from many different causes. Your caregiver has diagnosed you as having chest pain that is not specific for one problem, but does not require admission.  You are at low risk for an acute heart condition  or other serious illness.   Your vital signs today were: BP 137/89    Pulse 83    Temp 98.6 F (37 C) (Oral)    Resp (!) 21    Ht 5' 11.5" (1.816 m)    Wt 95.3 kg (210 lb)    SpO2 96%    BMI 28.88 kg/m  If your blood pressure (BP) was elevated above 135/85 this visit, please have this repeated by your doctor within one month. --------------

## 2016-08-14 NOTE — ED Notes (Signed)
Pt aware of need for urine sample. Urinal at bedside.  

## 2019-06-06 ENCOUNTER — Ambulatory Visit: Payer: Non-veteran care

## 2019-06-28 ENCOUNTER — Ambulatory Visit: Payer: Non-veteran care

## 2019-06-29 ENCOUNTER — Ambulatory Visit: Payer: Non-veteran care | Attending: Internal Medicine

## 2019-10-22 ENCOUNTER — Emergency Department (HOSPITAL_COMMUNITY)
Admission: EM | Admit: 2019-10-22 | Discharge: 2019-10-22 | Disposition: A | Discharge status: Home / Self Care | Payer: No Typology Code available for payment source | Attending: Emergency Medicine | Admitting: Emergency Medicine

## 2019-10-22 ENCOUNTER — Other Ambulatory Visit: Payer: Self-pay

## 2019-10-22 ENCOUNTER — Encounter (HOSPITAL_COMMUNITY): Payer: Self-pay | Admitting: Emergency Medicine

## 2019-10-22 DIAGNOSIS — Z5321 Procedure and treatment not carried out due to patient leaving prior to being seen by health care provider: Secondary | ICD-10-CM | POA: Diagnosis not present

## 2019-10-22 DIAGNOSIS — M25512 Pain in left shoulder: Secondary | ICD-10-CM | POA: Diagnosis not present

## 2019-10-22 NOTE — ED Triage Notes (Signed)
Pt. Stated, I woke this morning with left shoulder pain really bad this morning. In the mornings I have to adjust myself.

## 2019-10-22 NOTE — ED Notes (Signed)
Pt said they don't want to wait and left.

## 2020-07-23 ENCOUNTER — Other Ambulatory Visit: Payer: Self-pay

## 2020-07-23 ENCOUNTER — Telehealth (HOSPITAL_COMMUNITY): Payer: Self-pay | Admitting: Emergency Medicine

## 2020-07-23 ENCOUNTER — Emergency Department (HOSPITAL_COMMUNITY): Payer: No Typology Code available for payment source

## 2020-07-23 ENCOUNTER — Emergency Department (HOSPITAL_COMMUNITY)
Admission: EM | Admit: 2020-07-23 | Discharge: 2020-07-23 | Disposition: A | Discharge status: Home / Self Care | Payer: No Typology Code available for payment source | Attending: Emergency Medicine | Admitting: Emergency Medicine

## 2020-07-23 ENCOUNTER — Encounter (HOSPITAL_COMMUNITY): Payer: Self-pay

## 2020-07-23 DIAGNOSIS — Z87891 Personal history of nicotine dependence: Secondary | ICD-10-CM | POA: Insufficient documentation

## 2020-07-23 DIAGNOSIS — Z7982 Long term (current) use of aspirin: Secondary | ICD-10-CM | POA: Insufficient documentation

## 2020-07-23 DIAGNOSIS — R21 Rash and other nonspecific skin eruption: Secondary | ICD-10-CM | POA: Insufficient documentation

## 2020-07-23 DIAGNOSIS — I119 Hypertensive heart disease without heart failure: Secondary | ICD-10-CM | POA: Insufficient documentation

## 2020-07-23 DIAGNOSIS — M79602 Pain in left arm: Secondary | ICD-10-CM | POA: Diagnosis not present

## 2020-07-23 HISTORY — DX: Gastro-esophageal reflux disease without esophagitis: K21.9

## 2020-07-23 HISTORY — DX: Disorder of kidney and ureter, unspecified: N28.9

## 2020-07-23 IMAGING — CR DG CHEST 2V
2 series · 2 of 2 positions shown · non-contrast
Comparison: [DATE]

CLINICAL DATA: Cough

EXAM:
CHEST - 2 VIEW

[w chest pa]
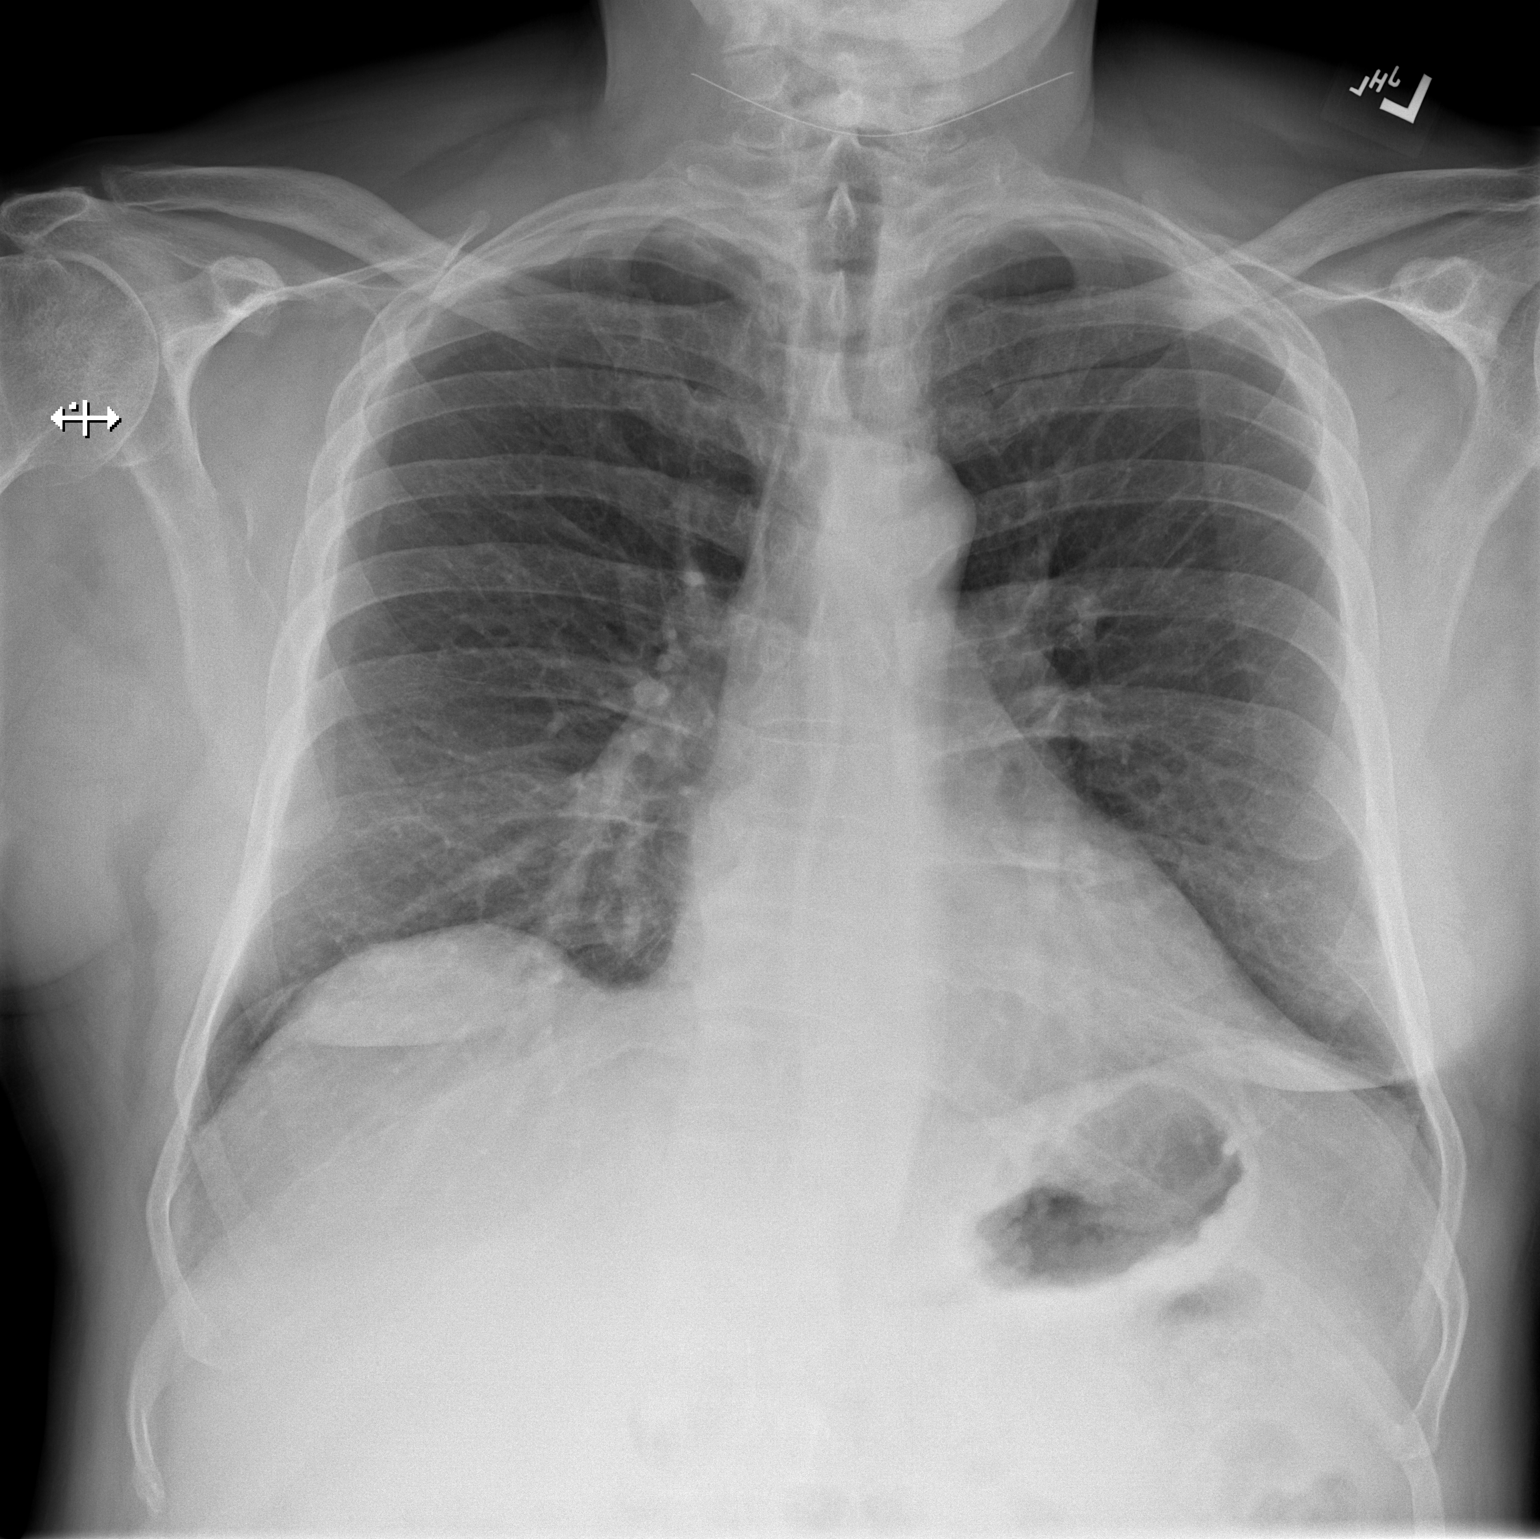

[w chest lat]
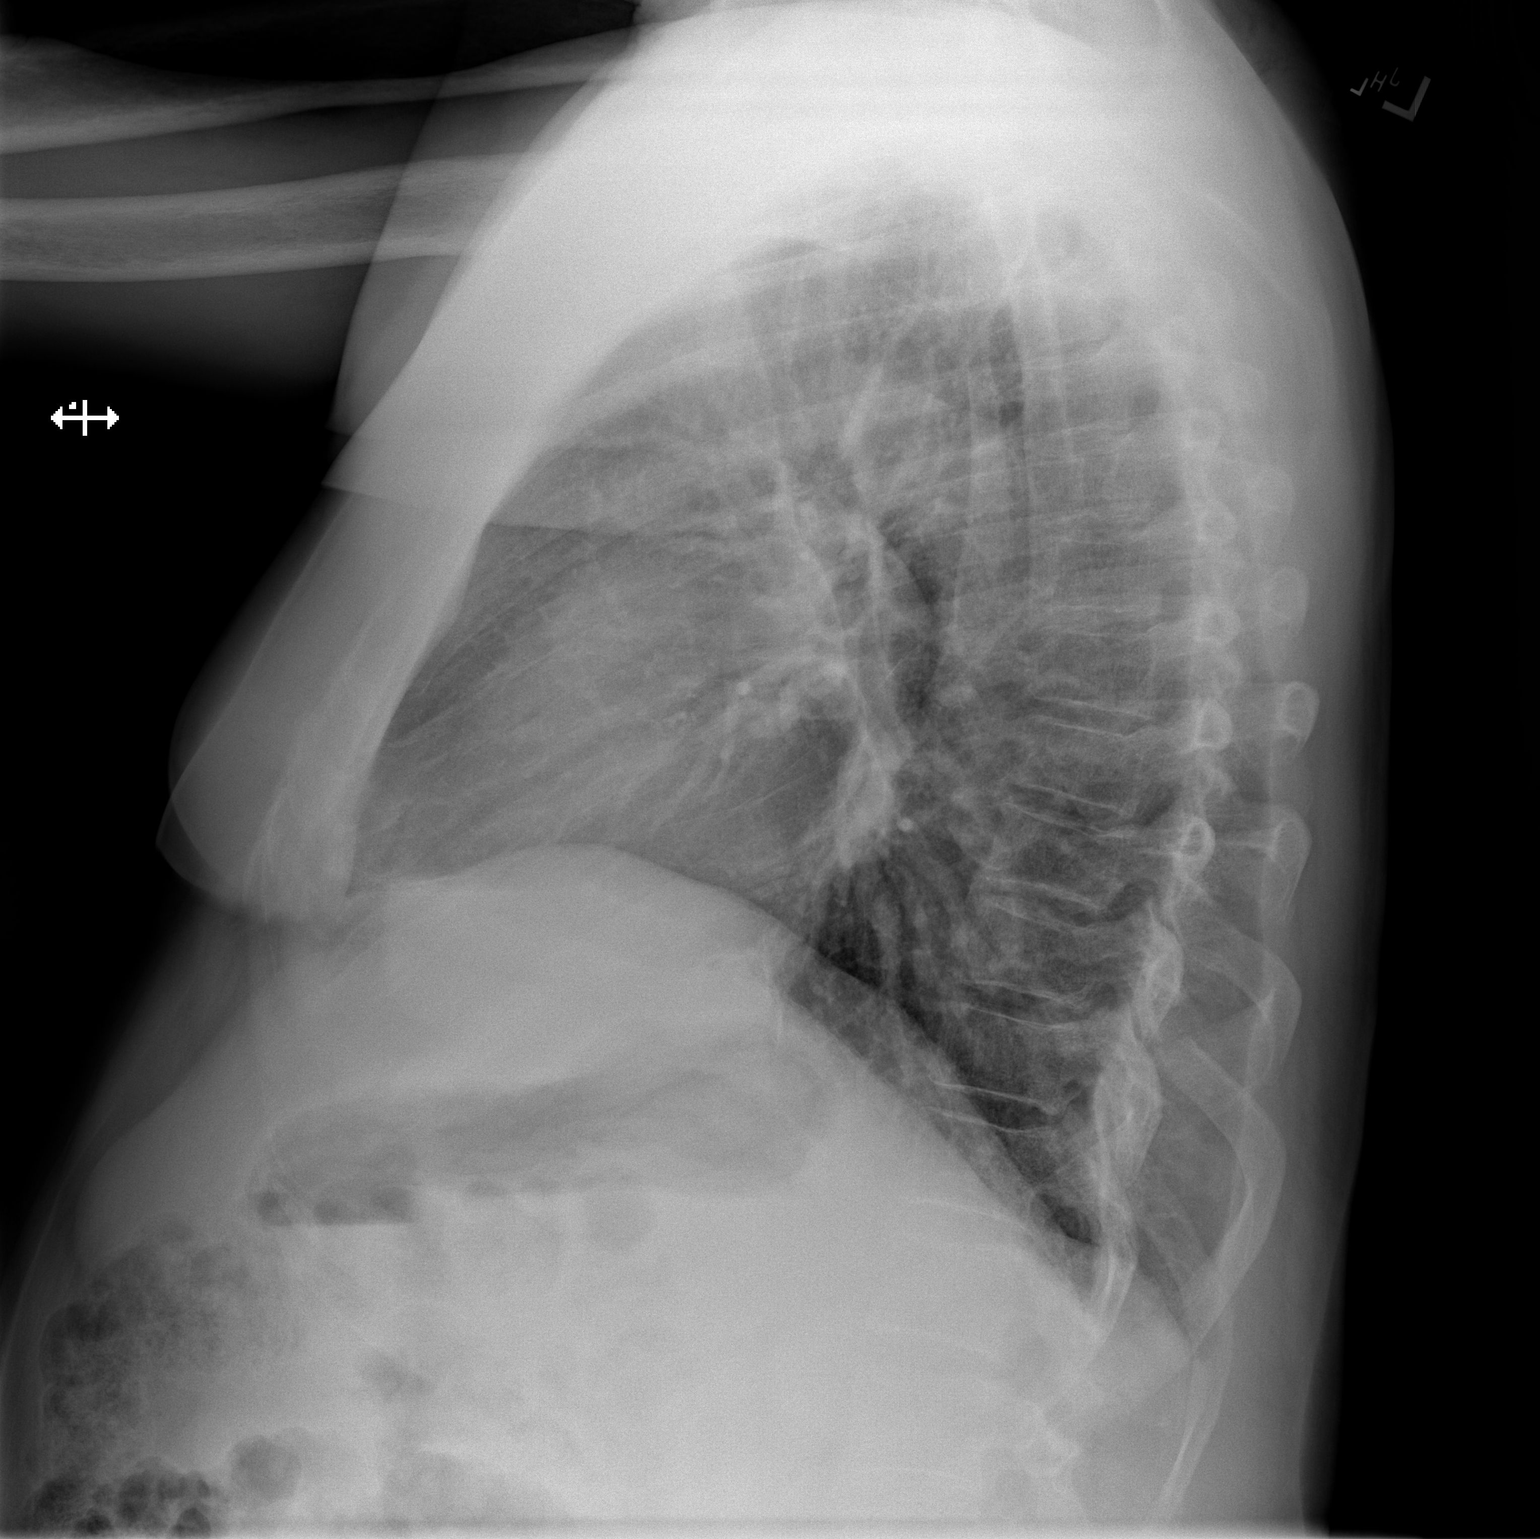

[2 of 2 positions shown; findings below may reference images not displayed]

FINDINGS: There is slight left base atelectasis. Lungs elsewhere clear. Heart
size and pulmonary vascularity are normal. No adenopathy. No bone
lesions.
IMPRESSION: Slight left base atelectasis. Lungs elsewhere clear. Cardiac
silhouette normal.

## 2020-07-23 MED ORDER — CLOTRIMAZOLE-BETAMETHASONE 1-0.05 % EX CREA: TOPICAL_CREAM | CUTANEOUS | 0 refills | Status: DC

## 2020-07-23 MED ORDER — HYDROCODONE-ACETAMINOPHEN 5-325 MG PO TABS: 1.0000 | ORAL_TABLET | Freq: Four times a day (QID) | ORAL | 0 refills | Status: DC | PRN

## 2020-07-23 MED ORDER — DOXYCYCLINE HYCLATE 100 MG PO CAPS: 100.0000 mg | ORAL_CAPSULE | Freq: Two times a day (BID) | ORAL | 0 refills | Status: DC

## 2020-07-23 NOTE — Discharge Instructions (Addendum)
Follow-up with your doctor in 2 to 3 weeks to evaluate your rash and your call again

## 2020-07-23 NOTE — ED Triage Notes (Addendum)
Patient c/o left axillary area rash x 1 month. Patient states "It burns".   Patient also c/o not being able to raise his left arm x 1-2 months. Patient and wife state that the patient has been to the [place] and had x-rays.  Patient's wife added that he has had a cough for over a month and the "[facility], but it has not worked."

## 2020-07-23 NOTE — Telephone Encounter (Cosign Needed)
Patient returned with printed rx for Norco and Doxycycline, states his pharmacy will not accept a printed prescription. New prescription sent electronically to patient's [place].

## 2020-07-23 NOTE — ED Provider Notes (Signed)
[facility] DEPT Provider Note   CSN: [id number] Arrival date & time: 07/23/20  1140     History Chief Complaint  Patient presents with  . Rash  . Arm Pain    Don Norman is a 75 y.o. male.  Patient complains of a rash under his axilla and a cough its been going on for a while that he is being treated with for reflux  The history is provided by the patient and medical records. No language interpreter was used.  Rash Location: Axilla. Quality: not blistering   Severity:  Moderate Onset quality:  Sudden Timing:  Constant Progression:  Worsening Chronicity:  New Context: not animal contact   Associated symptoms: no abdominal pain, no diarrhea, no fatigue and no headaches   Arm Pain Pertinent negatives include no chest pain, no abdominal pain and no headaches.       Past Medical History:  Diagnosis Date  . GERD (gastroesophageal reflux disease)   . Heart disease   . Hypertension   . Renal disorder     There are no problems to display for this patient.   History reviewed. No pertinent surgical history.     Family History  Family history unknown: Yes    Social History   Tobacco Use  . Smoking status: Former Research scientist (life sciences)  . Smokeless tobacco: Never Used  Vaping Use  . Vaping Use: Never used  Substance Use Topics  . Alcohol use: Yes  . Drug use: No    Home Medications Prior to Admission medications   Medication Sig Start Date End Date Taking? Authorizing Provider  clotrimazole-betamethasone (LOTRISONE) cream Apply to affected area 2 times a day for one week only 07/23/20  Yes Milton Ferguson, MD  doxycycline (VIBRAMYCIN) 100 MG capsule Take 1 capsule (100 mg total) by mouth 2 (two) times daily. One po bid x 7 days 07/23/20  Yes Milton Ferguson, MD  HYDROcodone-acetaminophen (NORCO/VICODIN) 5-325 MG tablet Take 1 tablet by mouth every 6 (six) hours as needed for moderate pain. 07/23/20  Yes Milton Ferguson, MD  aspirin EC 81 MG  tablet Take 81 mg by mouth daily.    [provider]  omeprazole (PRILOSEC) 20 MG capsule Take 1 capsule (20 mg total) by mouth daily. 08/14/16   Carlisle Cater, PA-C  ondansetron (ZOFRAN ODT) 4 MG disintegrating tablet Take 1 tablet (4 mg total) by mouth every 8 (eight) hours as needed for nausea or vomiting. 08/14/16   Gareth Morgan, MD  sucralfate (CARAFATE) 1 g tablet Take 1 tablet (1 g total) by mouth 4 (four) times daily -  with meals and at bedtime. 08/14/16   Carlisle Cater, PA-C    Allergies    Patient has no known allergies.  Review of Systems   Review of Systems  Constitutional: Negative for appetite change and fatigue.  HENT: Negative for congestion, ear discharge and sinus pressure.   Eyes: Negative for discharge.  Respiratory: Negative for cough.   Cardiovascular: Negative for chest pain.  Gastrointestinal: Negative for abdominal pain and diarrhea.  Genitourinary: Negative for frequency and hematuria.  Musculoskeletal: Negative for back pain.       Rash under left axilla  Skin: Positive for rash.  Neurological: Negative for seizures and headaches.  Psychiatric/Behavioral: Negative for hallucinations.    Physical Exam Updated Vital Signs BP 128/73 (BP Location: Right Arm)   Pulse 84   Temp 98.6 F (37 C) (Oral)   Resp 18   Ht 6' (1.829 m)  Wt 97.1 kg   SpO2 97%   BMI 29.02 kg/m   Physical Exam Vitals reviewed.  Constitutional:      Appearance: He is well-developed.  HENT:     Head: Normocephalic.     Nose: Nose normal.  Eyes:     General: No scleral icterus.    Conjunctiva/sclera: Conjunctivae normal.  Neck:     Thyroid: No thyromegaly.  Cardiovascular:     Rate and Rhythm: Normal rate and regular rhythm.     Heart sounds: No murmur heard. No friction rub. No gallop.   Pulmonary:     Breath sounds: No stridor. No wheezing or rales.  Chest:     Chest wall: No tenderness.  Abdominal:     General: There is no distension.     Tenderness:  There is no abdominal tenderness. There is no rebound.  Musculoskeletal:        General: Normal range of motion.     Cervical back: Neck supple.  Lymphadenopathy:     Cervical: No cervical adenopathy.  Skin:    Findings: Erythema and rash present.     Comments: Significant rash in her left axilla.  It appears to be fungal with possible bacterial component  Neurological:     Mental Status: He is alert and oriented to person, place, and time.     Motor: No abnormal muscle tone.     Coordination: Coordination normal.  Psychiatric:        Behavior: Behavior normal.     ED Results / Procedures / Treatments   Labs (all labs ordered are listed, but only abnormal results are displayed) Labs Reviewed - No data to display  EKG None  Radiology DG Chest 2 View  Result Date: 07/23/2020 CLINICAL DATA:  Cough EXAM: CHEST - 2 VIEW COMPARISON:  Aug 14, 2016 FINDINGS: There is slight left base atelectasis. Lungs elsewhere clear. Heart size and pulmonary vascularity are normal. No adenopathy. No bone lesions. IMPRESSION: Slight left base atelectasis. Lungs elsewhere clear. Cardiac silhouette normal. Electronically Signed   By: Lowella Grip III M.D.   On: 07/23/2020 12:30    Procedures Procedures   Medications Ordered in ED Medications - No data to display  ED Course  I have reviewed the triage vital signs and the nursing notes.  Pertinent labs & imaging results that were available during my care of the patient were reviewed by me and considered in my medical decision making (see chart for details).    MDM Rules/Calculators/A&P                          Patient with rash under left axilla.  We will treat him with Lotrisone and doxycycline.  He is also given some hydrocodone for pain and will follow up with his primary care in 2 to 3 weeks Final Clinical Impression(s) / ED Diagnoses Final diagnoses:  Rash    Rx / DC Orders ED Discharge Orders         Ordered     clotrimazole-betamethasone (LOTRISONE) cream        07/23/20 1401    doxycycline (VIBRAMYCIN) 100 MG capsule  2 times daily        07/23/20 1401    HYDROcodone-acetaminophen (NORCO/VICODIN) 5-325 MG tablet  Every 6 hours PRN        07/23/20 1401           Milton Ferguson, MD 07/26/20 1752

## 2020-09-19 ENCOUNTER — Encounter (HOSPITAL_COMMUNITY): Payer: Self-pay | Admitting: Oncology

## 2020-09-19 ENCOUNTER — Emergency Department (HOSPITAL_COMMUNITY): Payer: No Typology Code available for payment source

## 2020-09-19 ENCOUNTER — Emergency Department (HOSPITAL_COMMUNITY)
Admission: EM | Admit: 2020-09-19 | Discharge: 2020-09-20 | Disposition: A | Discharge status: Home / Self Care | Payer: No Typology Code available for payment source | Attending: Emergency Medicine | Admitting: Emergency Medicine

## 2020-09-19 ENCOUNTER — Other Ambulatory Visit: Payer: Self-pay

## 2020-09-19 DIAGNOSIS — Z7982 Long term (current) use of aspirin: Secondary | ICD-10-CM | POA: Diagnosis not present

## 2020-09-19 DIAGNOSIS — Z20822 Contact with and (suspected) exposure to covid-19: Secondary | ICD-10-CM | POA: Diagnosis not present

## 2020-09-19 DIAGNOSIS — M6281 Muscle weakness (generalized): Secondary | ICD-10-CM | POA: Diagnosis not present

## 2020-09-19 DIAGNOSIS — R509 Fever, unspecified: Secondary | ICD-10-CM | POA: Insufficient documentation

## 2020-09-19 DIAGNOSIS — R2681 Unsteadiness on feet: Secondary | ICD-10-CM

## 2020-09-19 DIAGNOSIS — I10 Essential (primary) hypertension: Secondary | ICD-10-CM | POA: Diagnosis not present

## 2020-09-19 DIAGNOSIS — Z87891 Personal history of nicotine dependence: Secondary | ICD-10-CM | POA: Insufficient documentation

## 2020-09-19 DIAGNOSIS — R262 Difficulty in walking, not elsewhere classified: Secondary | ICD-10-CM | POA: Diagnosis present

## 2020-09-19 LAB — CBC WITH DIFFERENTIAL/PLATELET
Abs Immature Granulocytes: 0.03 10*3/uL (ref 0.00–0.07)
Basophils Absolute: 0 10*3/uL (ref 0.0–0.1)
Basophils Relative: 0 %
Eosinophils Absolute: 0 10*3/uL (ref 0.0–0.5)
Eosinophils Relative: 0 %
HCT: 49.7 % (ref 39.0–52.0)
Hemoglobin: 17 g/dL (ref 13.0–17.0)
Immature Granulocytes: 0 %
Lymphocytes Relative: 22 %
Lymphs Abs: 1.9 10*3/uL (ref 0.7–4.0)
MCH: 34.5 pg — ABNORMAL HIGH (ref 26.0–34.0)
MCHC: 34.2 g/dL (ref 30.0–36.0)
MCV: 100.8 fL — ABNORMAL HIGH (ref 80.0–100.0)
Monocytes Absolute: 0.7 10*3/uL (ref 0.1–1.0)
Monocytes Relative: 8 %
Neutro Abs: 6 10*3/uL (ref 1.7–7.7)
Neutrophils Relative %: 70 %
Platelets: 99 10*3/uL — ABNORMAL LOW (ref 150–400)
RBC: 4.93 MIL/uL (ref 4.22–5.81)
RDW: 12.7 % (ref 11.5–15.5)
WBC: 8.6 10*3/uL (ref 4.0–10.5)
nRBC: 0 % (ref 0.0–0.2)

## 2020-09-19 LAB — COMPREHENSIVE METABOLIC PANEL
ALT: 25 U/L (ref 0–44)
AST: 48 U/L — ABNORMAL HIGH (ref 15–41)
Albumin: 3.7 g/dL (ref 3.5–5.0)
Alkaline Phosphatase: 84 U/L (ref 38–126)
Anion gap: 10 (ref 5–15)
BUN: 11 mg/dL (ref 8–23)
CO2: 23 mmol/L (ref 22–32)
Calcium: 9 mg/dL (ref 8.9–10.3)
Chloride: 94 mmol/L — ABNORMAL LOW (ref 98–111)
Creatinine, Ser: 0.89 mg/dL (ref 0.61–1.24)
GFR, Estimated: >60 mL/min (ref >60)
Glucose, Bld: 136 mg/dL — ABNORMAL HIGH (ref 70–99)
Potassium: 4.4 mmol/L (ref 3.5–5.1)
Sodium: 127 mmol/L — ABNORMAL LOW (ref 135–145)
Total Bilirubin: 4.1 mg/dL — ABNORMAL HIGH (ref 0.3–1.2)
Total Protein: 8.1 g/dL (ref 6.5–8.1)

## 2020-09-19 LAB — URINALYSIS, ROUTINE W REFLEX MICROSCOPIC
Bilirubin Urine: NEGATIVE
Glucose, UA: NEGATIVE mg/dL
Ketones, ur: 5 mg/dL — AB
Nitrite: NEGATIVE
Protein, ur: 30 mg/dL — AB
Specific Gravity, Urine: 1.02 (ref 1.005–1.030)
pH: 6 (ref 5.0–8.0)

## 2020-09-19 LAB — RESP PANEL BY RT-PCR (FLU A&B, COVID) ARPGX2
Influenza A by PCR: NEGATIVE
Influenza B by PCR: NEGATIVE
SARS Coronavirus 2 by RT PCR: NEGATIVE

## 2020-09-19 LAB — ETHANOL: Alcohol, Ethyl (B): <10 mg/dL (ref <10)

## 2020-09-19 LAB — LACTIC ACID, PLASMA: Lactic Acid, Venous: 2.8 mmol/L (ref 0.5–1.9)

## 2020-09-19 LAB — SEDIMENTATION RATE: Sed Rate: 3 mm/hr (ref 0–16)

## 2020-09-19 IMAGING — MR MR HEAD W/O CM
10 series · 48 of 48 positions shown · non-contrast
Comparison: None.

CLINICAL DATA: Acute neurologic deficit

EXAM:
MRI HEAD WITHOUT CONTRAST
TECHNIQUE: Multiplanar, multiecho pulse sequences of the brain and surrounding
structures were obtained without intravenous contrast.

[Series 5: DWI · axial · 3.0mm · 1.36mm/px · z∈[-74,+65]mm · 9 of 96 slices shown (1 of 2)]
[im 1/96]
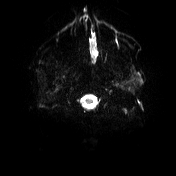
[im 12/96]
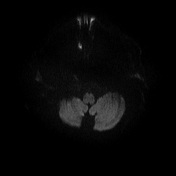
[im 24/96]
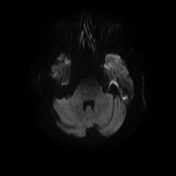
[im 36/96]
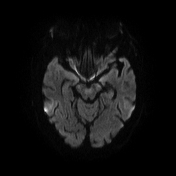
[im 48/96]
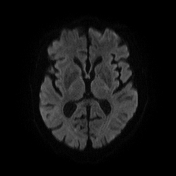
[im 60/96]
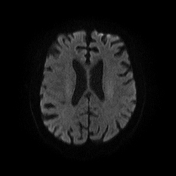
[im 72/96]
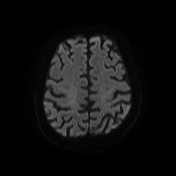
[im 84/96]
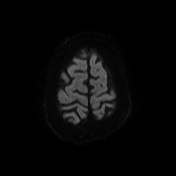
[im 96/96]
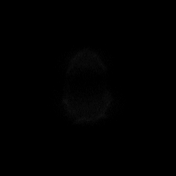

[Series 6: DWI · axial · 3.0mm · 1.36mm/px · z∈[-74,+65]mm · 5 of 48 slices shown (2 of 2)]
[im 1/48]
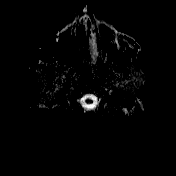
[im 12/48]
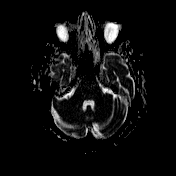
[im 24/48]
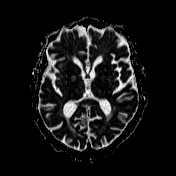
[im 36/48]
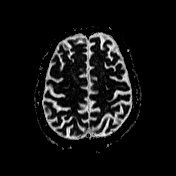
[im 48/48]
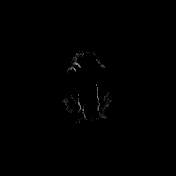

[Series 7: T1 · sagittal · 5.0mm · 0.75mm/px · 2 of 24 slices shown (1 of 2)]
[im 1/24]
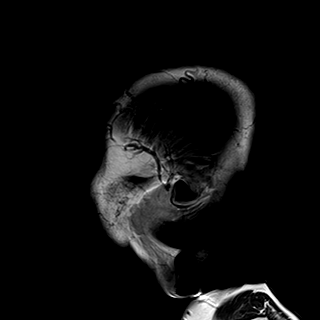
[im 24/24]
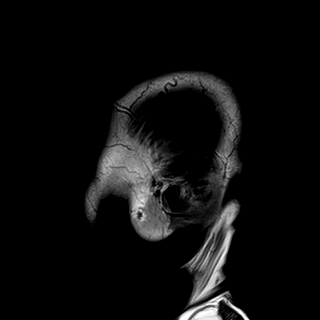

[Series 8: T2 · axial · 5.0mm · 0.62mm/px · z∈[-82,+78]mm · 2 of 26 slices shown (1 of 2)]
[im 1/26]
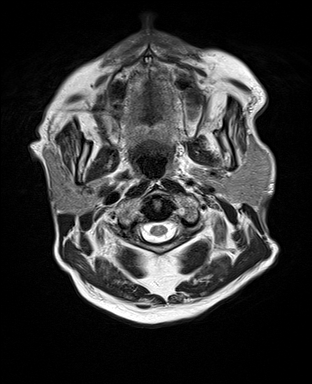
[im 26/26]
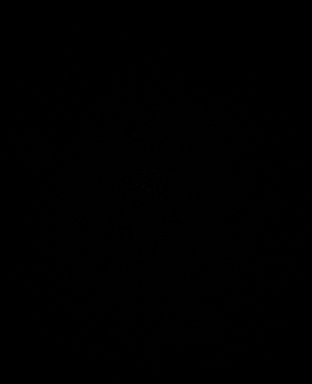

[Series 9: swi_images · axial · 3.0mm · 0.75mm/px · z∈[-89,+85]mm · 5 of 60 slices shown]
[im 1/60]
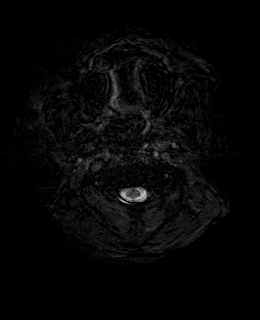
[im 15/60]
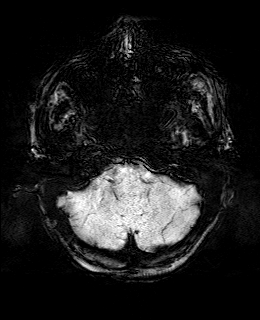
[im 30/60]
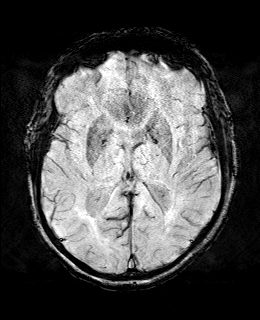
[im 45/60]
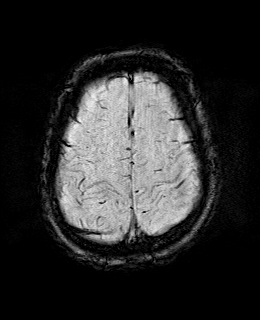
[im 60/60]
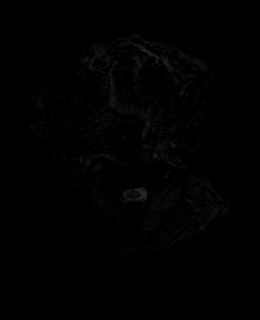

[Series 11: FLAIR · axial · 3.0mm · 0.75mm/px · z∈[-78,+73]mm · 4 of 52 slices shown]
[im 1/52]
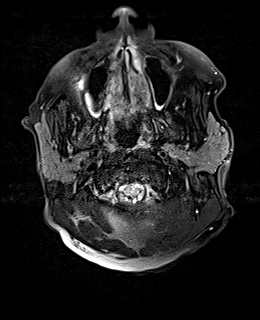
[im 18/52]
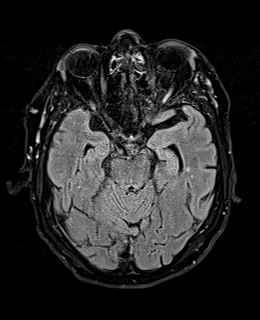
[im 35/52]
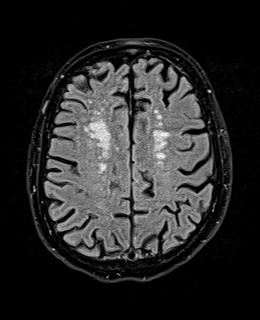
[im 52/52]
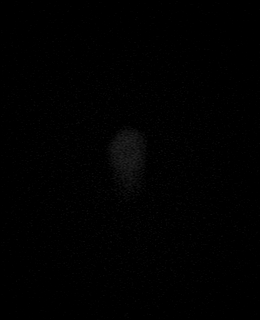

[Series 12: T1 · axial · 1.0mm · 0.94mm/px · z∈[-73,+68]mm · 12 of 144 slices shown (2 of 2)]
[im 1/144]
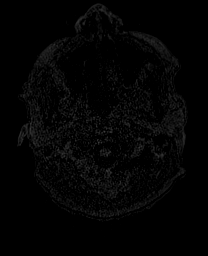
[im 14/144]
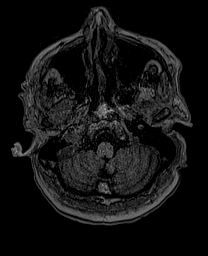
[im 27/144]
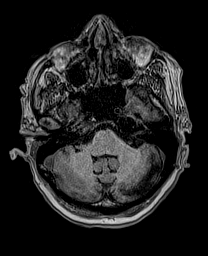
[im 40/144]
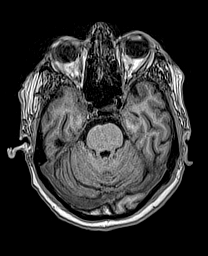
[im 53/144]
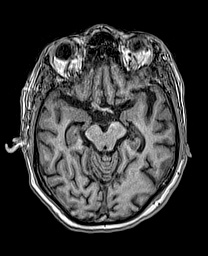
[im 66/144]
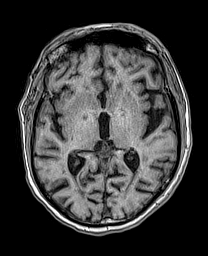
[im 79/144]
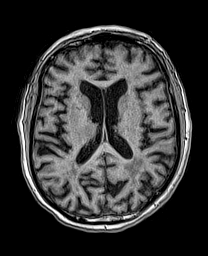
[im 92/144]
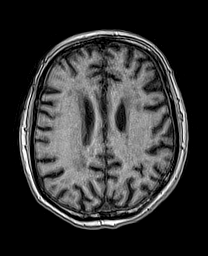
[im 105/144]
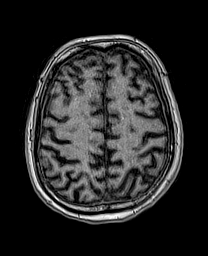
[im 118/144]
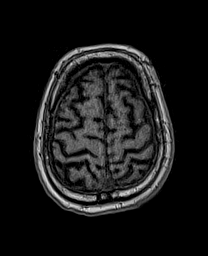
[im 131/144]
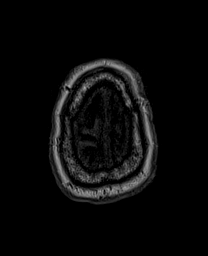
[im 144/144]
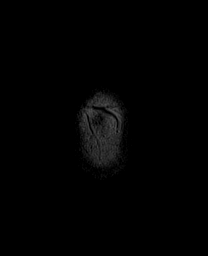

[Series 13: cor dwi_tracew · coronal · 5.0mm · 1.53mm/px · 4 of 52 slices shown]
[im 1/52]
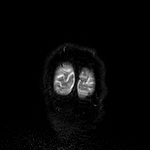
[im 18/52]
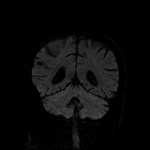
[im 35/52]
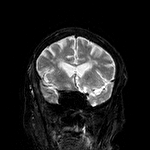
[im 52/52]
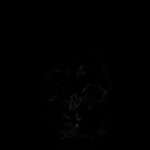

[Series 14: cor dwi_adc · coronal · 5.0mm · 1.53mm/px · 2 of 26 slices shown]
[im 1/26]
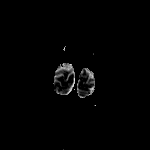
[im 26/26]
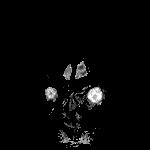

[Series 15: T2 · coronal · 5.0mm · 0.57mm/px · 3 of 35 slices shown (2 of 2)]
[im 1/35]
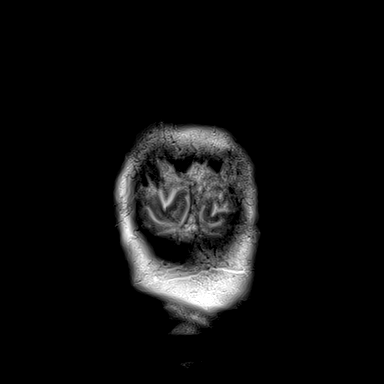
[im 18/35]
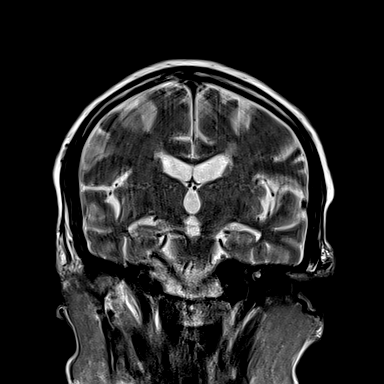
[im 35/35]
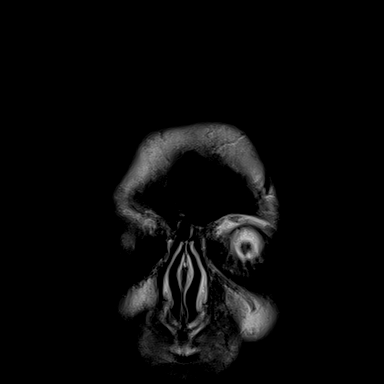

[48 of 48 positions shown; findings below may reference images not displayed]

FINDINGS: Brain: No acute infarct, mass effect or extra-axial collection. No
acute or chronic hemorrhage. Hyperintense T2-weighted signal is
moderately widespread throughout the white matter. Generalized
volume loss without a clear lobar predilection. The midline
structures are normal.

Vascular: Major flow voids are preserved.

Skull and upper cervical spine: Normal calvarium and skull base.
Visualized upper cervical spine and soft tissues are normal.

Sinuses/Orbits:No paranasal sinus fluid levels or advanced mucosal
thickening. No mastoid or middle ear effusion. Normal orbits.
IMPRESSION: 1. No acute intracranial abnormality.
2. Generalized volume loss and findings of chronic small vessel
ischemia.

## 2020-09-19 IMAGING — MR MR THORACIC SPINE WO/W CM
8 of 9 series · 33 of 48 positions shown · IV contrast (gadavist)
Comparison: None.

CLINICAL DATA: Low back pain.  Concern for infection.

EXAM:
MRI THORACIC AND LUMBAR SPINE WITHOUT AND WITH CONTRAST
TECHNIQUE: Multiplanar and multiecho pulse sequences of the thoracic and lumbar
spine were obtained without and with intravenous contrast.
CONTRAST:  10mL GADAVIST GADOBUTROL 1 MMOL/ML IV SOLN

[Series 16: T1 · sagittal · 4.0mm · 1.72mm/px · 2 of 5 slices shown (1 of 3)]
[im 1/5]
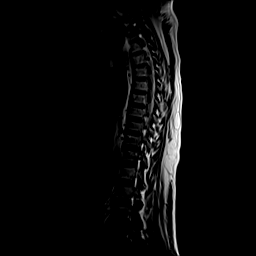
[im 5/5]
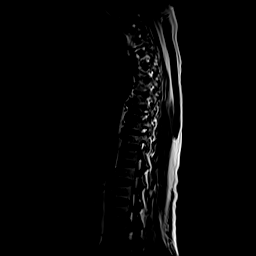

[Series 17: STIR · sagittal · 3.0mm · 1.00mm/px · 4 of 15 slices shown]
[im 1/15]
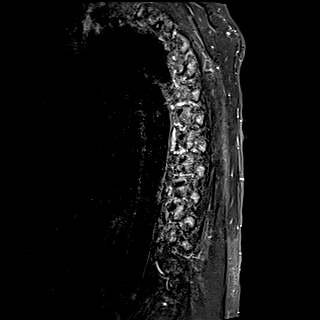
[im 5/15]
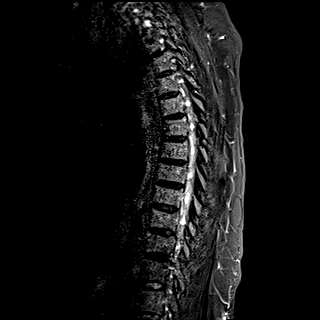
[im 10/15]
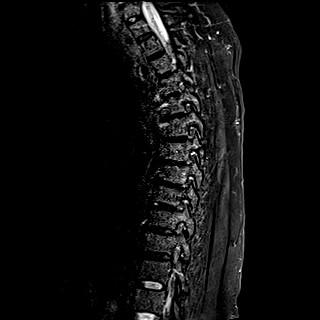
[im 15/15]
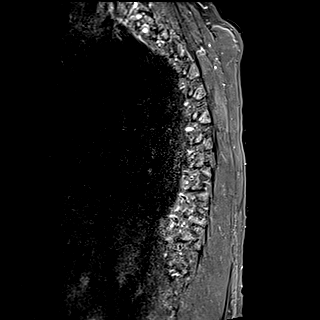

[Series 18: T1 · sagittal · 3.0mm · 1.00mm/px · 4 of 15 slices shown (2 of 3)]
[im 1/15]
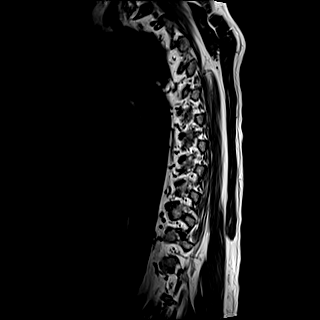
[im 5/15]
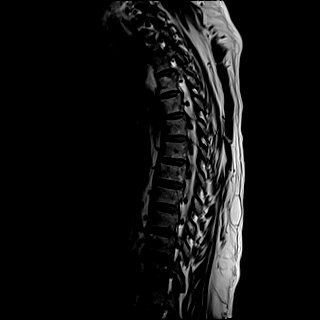
[im 10/15]
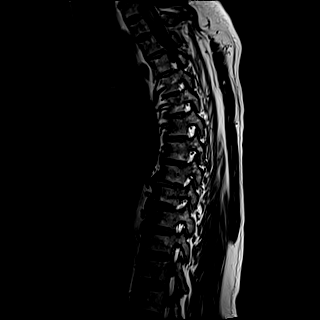
[im 15/15]
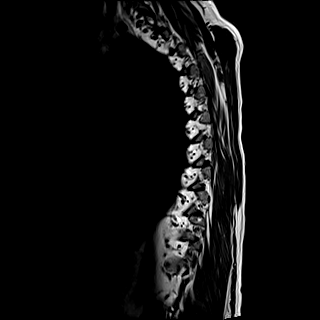

[Series 20: T2 · axial · 4.0mm · 0.78mm/px · z∈[-265,-51]mm · 8 of 39 slices shown]
[im 1/39]
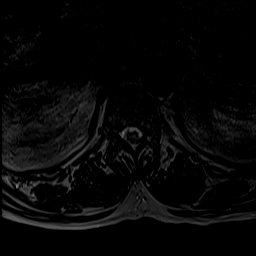
[im 6/39]
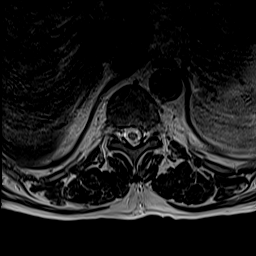
[im 11/39]
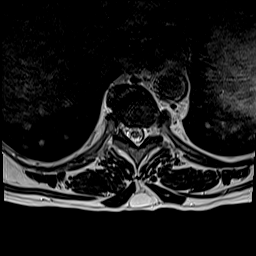
[im 17/39]
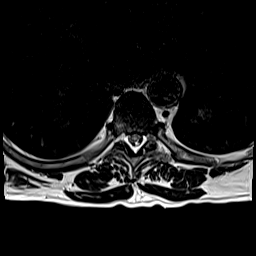
[im 22/39]
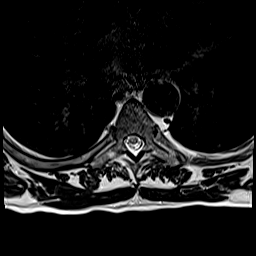
[im 28/39]
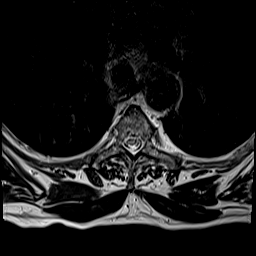
[im 33/39]
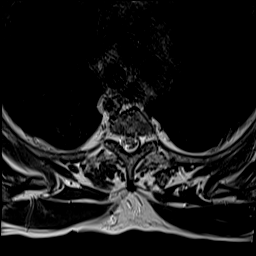
[im 39/39]
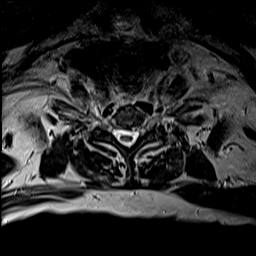

[Series 22: T1 · axial · 4.0mm · 0.39mm/px · z∈[-265,-51]mm · 8 of 39 slices shown (3 of 3)]
[im 1/39]
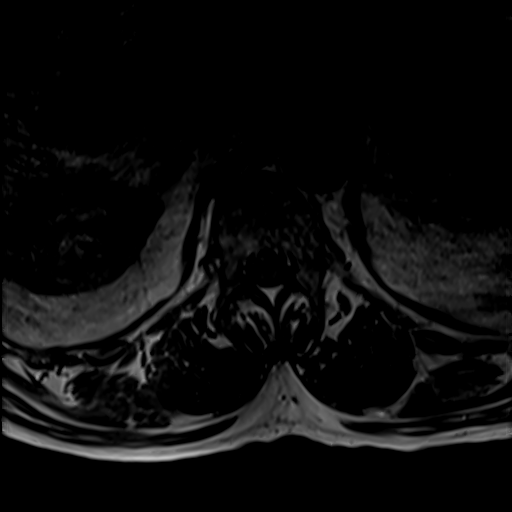
[im 6/39]
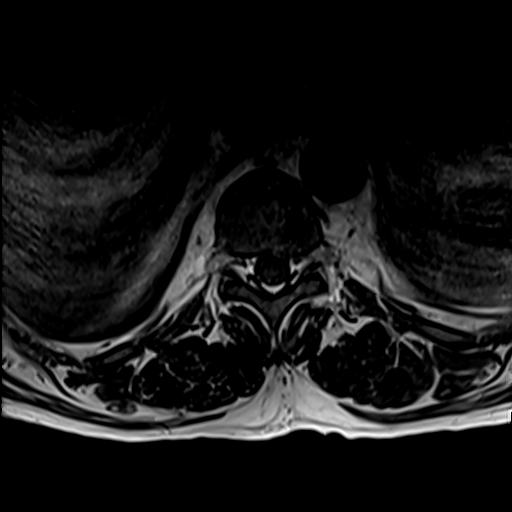
[im 11/39]
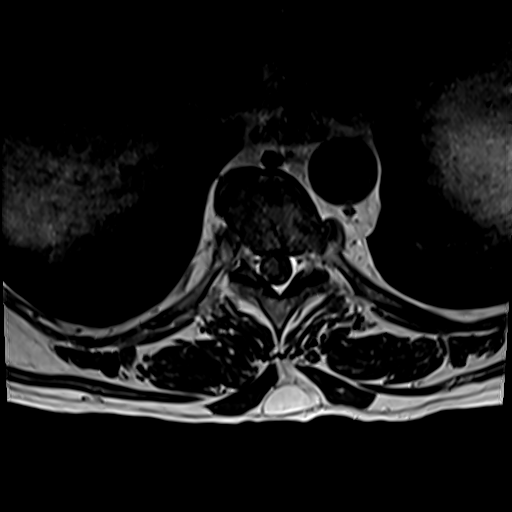
[im 17/39]
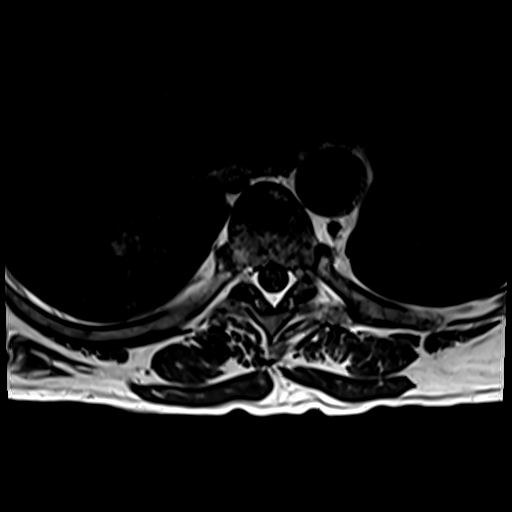
[im 22/39]
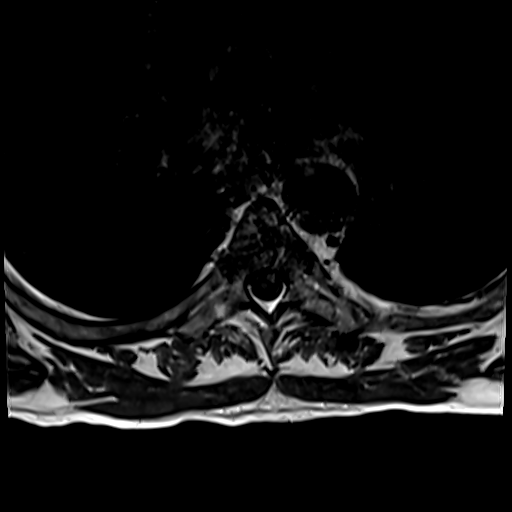
[im 28/39]
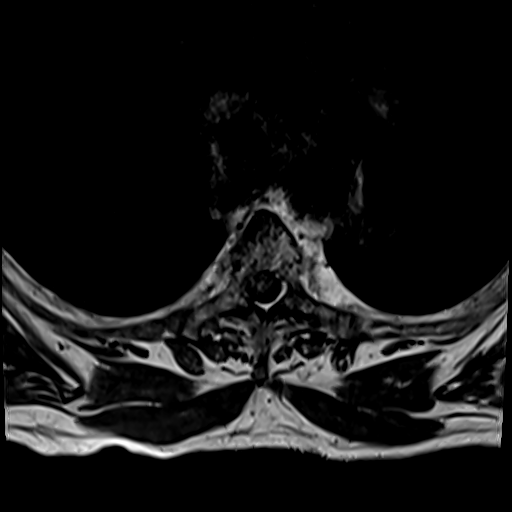
[im 33/39]
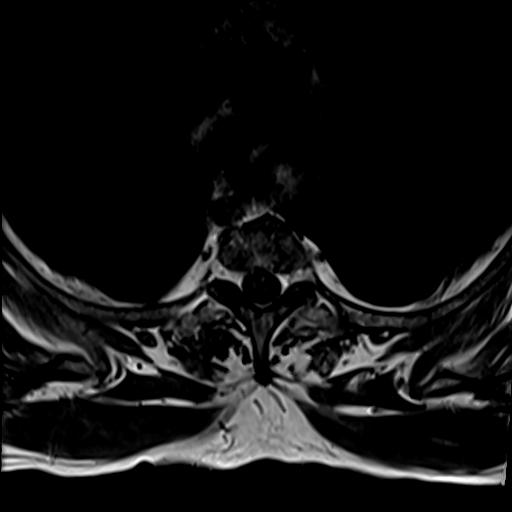
[im 39/39]
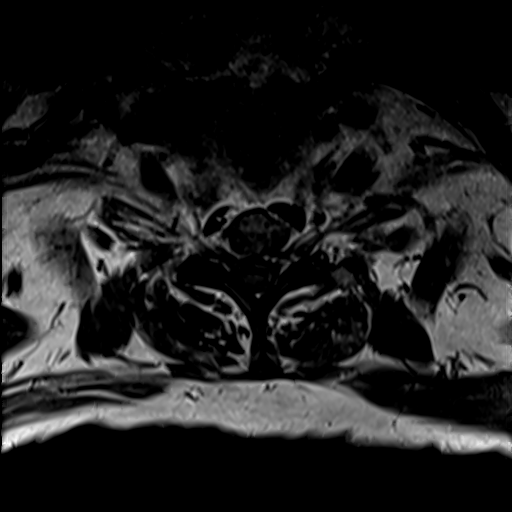

[Series 28: T2 post-contrast · sagittal · 3.0mm · 0.83mm/px · 3 of 15 slices shown]
[im 1/15]
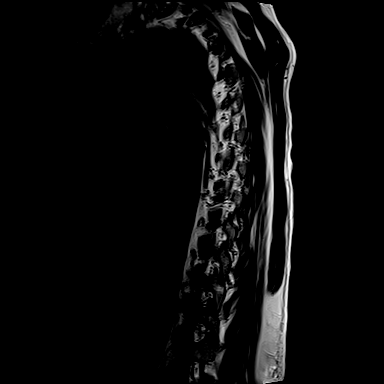
[im 8/15]
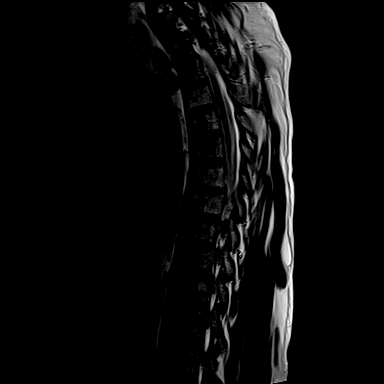
[im 15/15]
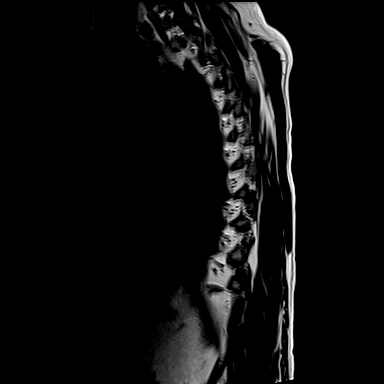

[Series 29: T1 fat-sat post-contrast · sagittal · 3.0mm · 1.00mm/px · 3 of 15 slices shown]
[im 1/15]
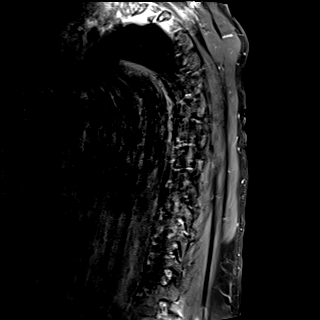
[im 8/15]
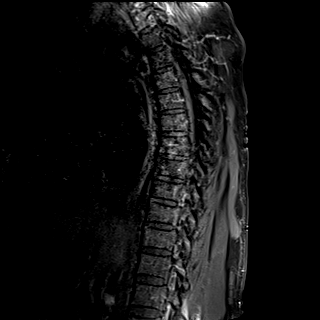
[im 15/15]
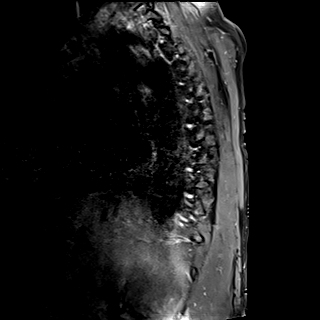

[Series 30: T1 post-contrast · axial · 4.0mm · 0.39mm/px · 1 of 39 slices shown]
[im 1/39]
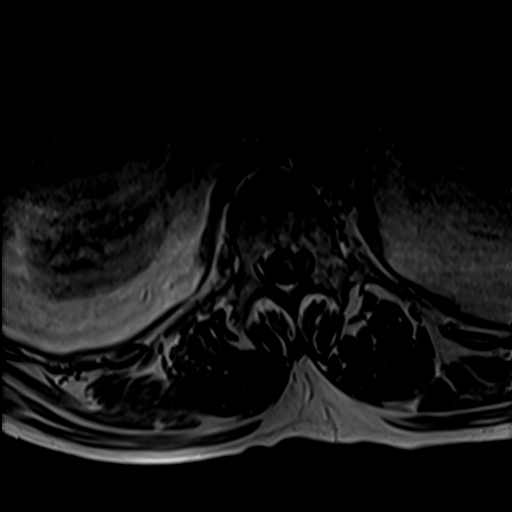

[33 of 48 positions shown; findings below may reference images not displayed]

FINDINGS: MRI THORACIC SPINE FINDINGS

Alignment:  Physiologic.

Vertebrae: No fracture, evidence of discitis, or bone lesion.

Cord:  Normal signal and morphology.

Paraspinal and other soft tissues: Negative.

Disc levels:

No spinal canal or neural foraminal stenosis. No epidural
collection.

MRI LUMBAR SPINE FINDINGS

Segmentation:  Standard.

Alignment:  Physiologic.

Vertebrae:  No fracture, evidence of discitis, or bone lesion.

Conus medullaris: Extends to the L1 level and appears normal.

Paraspinal and other soft tissues: Negative

Disc levels:

L1-L2: Normal disc space and facet joints. No spinal canal stenosis.
No neural foraminal stenosis.

L2-L3: Small disc bulge. Mild facet hypertrophy. Mild spinal canal
stenosis. No neural foraminal stenosis.

L3-L4: Disc desiccation. No herniation. No spinal canal stenosis. No
neural foraminal stenosis.

L4-L5: Small disc bulge with endplate spurring. No spinal canal
stenosis. Moderate right neural foraminal stenosis.

L5-S1: Disc space narrowing with small bulge and endplate spurring.
No spinal canal stenosis. No neural foraminal stenosis.

Visualized sacrum: Normal.
IMPRESSION: 1. No acute abnormality of the thoracic or lumbar spine.
2. Mild L2-L3 spinal canal stenosis.
3. Moderate right L4-L5 neural foraminal stenosis.
4. No spinal canal or neural foraminal stenosis at other levels.

## 2020-09-19 MED ORDER — LABETALOL HCL 5 MG/ML IV SOLN
10.0000 mg | Freq: Once | INTRAVENOUS | Status: AC
  Administered 2020-09-19: 10 mg via INTRAVENOUS
  Filled 2020-09-19: qty 4

## 2020-09-19 MED ORDER — ACETAMINOPHEN 325 MG PO TABS
650.0000 mg | ORAL_TABLET | Freq: Once | ORAL | Status: AC
  Administered 2020-09-19: 650 mg via ORAL
  Filled 2020-09-19: qty 2

## 2020-09-19 MED ORDER — GADOBUTROL 1 MMOL/ML IV SOLN
10.0000 mL | Freq: Once | INTRAVENOUS | Status: AC | PRN
  Administered 2020-09-19: 10 mL via INTRAVENOUS

## 2020-09-19 NOTE — ED Provider Notes (Signed)
  Provider Note MRN:  [id number]  Arrival date & time: 09/19/20    ED Course and Medical Decision Making  Assumed care from Dr. Almyra Free at shift change.  Lower extremity weakness, fever, awaiting MRI imaging, concern for epidural abscess.  MRI imaging is reassuring.  No evidence of epidural abscess or infectious source.  Chest x-ray added on given the fever, which is normal.  Urinalysis is overall without clear evidence of infection, culture is sent.  Patient is feeling much better, given liter of fluids, ambulating at his baseline at this time.  COVID test is negative.  Unclear source of fever, suspect viral process, patient and patient's wife feel comfortable with discharge and return precautions, will follow up with primary care  Procedures  Final Clinical Impressions(s) / ED Diagnoses     ICD-10-CM   1. Unsteady gait  R26.81       ED Discharge Orders     None       Discharge Instructions   None     Barth Kirks. [place] Small, [place] [email or web address]    Maudie Flakes, MD 09/20/20 [phone]

## 2020-09-19 NOTE — ED Notes (Signed)
Patient transported to MRI 

## 2020-09-19 NOTE — ED Provider Notes (Signed)
[facility] DEPT Provider Note   CSN: [id number] Arrival date & time: 09/19/20  1756     History Chief Complaint  Patient presents with   Weakness    B/L legs    Don Norman is a 75 y.o. male.  Patient presents chief complaint of bilateral leg weakness right greater than left, difficulty walking.  Patient states that he woke up this morning with some leg weakness but was able to go throughout his stay.  And then later on in the afternoon he was taking a shower when he feels like his legs gave out.  He states he could not stand up without his wife's help.  He lost control of his bowel and had persistent weakness of his legs so he came into the ER.  Otherwise denies any fevers or cough or vomiting or diarrhea.  Denies headache chest pain or abdominal pain.  Denies back pain.  Denies any history of IV drug use.  Denies prior history of any similar weakness in the past.      Past Medical History:  Diagnosis Date   GERD (gastroesophageal reflux disease)    Heart disease    Hypertension    Renal disorder     There are no problems to display for this patient.   History reviewed. No pertinent surgical history.     Family History  Family history unknown: Yes    Social History   Tobacco Use   Smoking status: Former    Pack years: 0.00   Smokeless tobacco: Never  Vaping Use   Vaping Use: Never used  Substance Use Topics   Alcohol use: Yes   Drug use: No    Home Medications Prior to Admission medications   Medication Sig Start Date End Date Taking? Authorizing Provider  aspirin EC 81 MG tablet Take 81 mg by mouth daily.    [provider]  clotrimazole-betamethasone (LOTRISONE) cream Apply to affected area 2 times a day for one week only 07/23/20   Tacy Learn, PA-C  omeprazole (PRILOSEC) 20 MG capsule Take 1 capsule (20 mg total) by mouth daily. 08/14/16   Carlisle Cater, PA-C  ondansetron (ZOFRAN ODT) 4 MG  disintegrating tablet Take 1 tablet (4 mg total) by mouth every 8 (eight) hours as needed for nausea or vomiting. 08/14/16   Gareth Morgan, MD  sucralfate (CARAFATE) 1 g tablet Take 1 tablet (1 g total) by mouth 4 (four) times daily -  with meals and at bedtime. 08/14/16   Carlisle Cater, PA-C    Allergies    Patient has no known allergies.  Review of Systems   Review of Systems  Constitutional:  Negative for fever.  HENT:  Negative for ear pain and sore throat.   Eyes:  Negative for pain.  Respiratory:  Negative for cough.   Cardiovascular:  Negative for chest pain.  Gastrointestinal:  Negative for abdominal pain.  Genitourinary:  Negative for flank pain.  Musculoskeletal:  Negative for back pain.  Skin:  Negative for color change and rash.  Neurological:  Negative for syncope.  All other systems reviewed and are negative.  Physical Exam Updated Vital Signs BP (!) 172/95   Pulse 84   Temp (!) 100.9 F (38.3 C) (Rectal)   Resp (!) 27   Ht 6' (1.829 m)   Wt 95.7 kg   SpO2 96%   BMI 28.62 kg/m   Physical Exam Constitutional:      General: He is not in  acute distress.    Appearance: He is well-developed.  HENT:     Head: Normocephalic.     Nose: Nose normal.  Eyes:     Extraocular Movements: Extraocular movements intact.  Cardiovascular:     Rate and Rhythm: Normal rate.  Pulmonary:     Effort: Pulmonary effort is normal.  Skin:    Coloration: Skin is not jaundiced.  Neurological:     Mental Status: He is alert.     Comments: Bilateral upper extremities are 5/5 strength.  Patient is able to lift bilateral lower extremities off the bed and hold them off the bed but it takes great effort for him to do so.  Strength is otherwise 4 out of 5 bilateral lower extremities.  Rectal tone is otherwise normal no saddle anesthesia noted.  On attempt and with the patient patient has a difficult time even sitting up in his bed and very difficult time standing without  significant help.    ED Results / Procedures / Treatments   Labs (all labs ordered are listed, but only abnormal results are displayed) Labs Reviewed  CBC WITH DIFFERENTIAL/PLATELET - Abnormal; Notable for the following components:      Result Value   MCV 100.8 (*)    MCH 34.5 (*)    Platelets 99 (*)    All other components within normal limits  COMPREHENSIVE METABOLIC PANEL - Abnormal; Notable for the following components:   Sodium 127 (*)    Chloride 94 (*)    Glucose, Bld 136 (*)    AST 48 (*)    Total Bilirubin 4.1 (*)    All other components within normal limits  LACTIC ACID, PLASMA - Abnormal; Notable for the following components:   Lactic Acid, Venous 2.8 (*)    All other components within normal limits  URINALYSIS, ROUTINE W REFLEX MICROSCOPIC - Abnormal; Notable for the following components:   Color, Urine AMBER (*)    Hgb urine dipstick SMALL (*)    Ketones, ur 5 (*)    Protein, ur 30 (*)    Leukocytes,Ua TRACE (*)    Bacteria, UA RARE (*)    All other components within normal limits  CULTURE, BLOOD (ROUTINE X 2)  CULTURE, BLOOD (ROUTINE X 2)  URINE CULTURE  RESP PANEL BY RT-PCR (FLU A&B, COVID) ARPGX2  SEDIMENTATION RATE  ETHANOL  C-REACTIVE PROTEIN  LACTIC ACID, PLASMA    EKG EKG Interpretation  Date/Time:  Wednesday September 19 2020 19:39:31 EDT Ventricular Rate:  73 PR Interval:  175 QRS Duration: 152 QT Interval:  481 QTC Calculation: 531 R Axis:   209 Text Interpretation: Sinus rhythm Supraventricular bigeminy Nonspecific intraventricular conduction delay Anterior infarct, old Confirmed by Thamas Jaegers ([id number]) on 09/19/2020 8:13:39 PM  Radiology No results found.  Procedures Procedures   Medications Ordered in ED Medications  labetalol (NORMODYNE) injection 10 mg (10 mg Intravenous Given 09/19/20 2158)  acetaminophen (TYLENOL) tablet 650 mg (650 mg Oral Given 09/19/20 2157)    ED Course  I have reviewed the triage vital signs and the nursing  notes.  Pertinent labs & imaging results that were available during my care of the patient were reviewed by me and considered in my medical decision making (see chart for details).    MDM Rules/Calculators/A&P                          Etiology of his symptoms unclear I did consider stroke, however it does  appear to affect bilateral lower extremities.  I did consider spinal cord etiology but the patient has no complaints of back pain.  He is noted to be febrile here in the ER.  ESR CRP ordered.  MRI of the brain and spine ordered and pending.   Final Clinical Impression(s) / ED Diagnoses Final diagnoses:  Unsteady gait    Rx / DC Orders ED Discharge Orders     None        Luna Fuse, MD 09/19/20 2221

## 2020-09-19 NOTE — ED Triage Notes (Signed)
Pt bib [facility] from home after experiencing b/l leg weakness.  Per EMS pt reports that he normally sits around and consumes beer.  Pt states his legs began to feel weak as well as intermittently cramping.  Pt then took hot shower where his legs felt significantly more weak. Pt was able to sit down on seat in shower, did have an episode of bowel incontinence.  Per EMS pt had no signs of CVA.

## 2020-09-19 NOTE — ED Notes (Signed)
09/19/20 2153 Critical lactic resulted  Test: Lactic Acid Critical Value: 2.8  Name of Provider Notified: Almyra Free, MD  Orders Received? Or Actions Taken?: no orders given

## 2020-09-20 ENCOUNTER — Emergency Department (HOSPITAL_COMMUNITY): Payer: No Typology Code available for payment source

## 2020-09-20 LAB — LACTIC ACID, PLASMA: Lactic Acid, Venous: 1.6 mmol/L (ref 0.5–1.9)

## 2020-09-20 IMAGING — MR MR LUMBAR SPINE WO/W CM
7 of 9 series · 30 of 48 positions shown · IV contrast (gadavist)
Comparison: None.

CLINICAL DATA: Low back pain.  Concern for infection.

EXAM:
MRI THORACIC AND LUMBAR SPINE WITHOUT AND WITH CONTRAST
TECHNIQUE: Multiplanar and multiecho pulse sequences of the thoracic and lumbar
spine were obtained without and with intravenous contrast.
CONTRAST:  10mL GADAVIST GADOBUTROL 1 MMOL/ML IV SOLN

[Series 5: T1 · sagittal · 4.0mm · 0.81mm/px · 3 of 15 slices shown (1 of 2)]
[im 1/15]
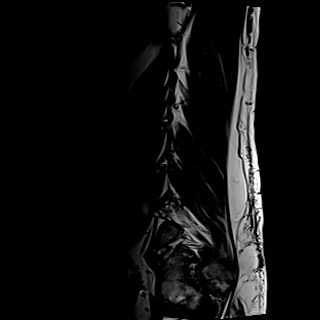
[im 8/15]
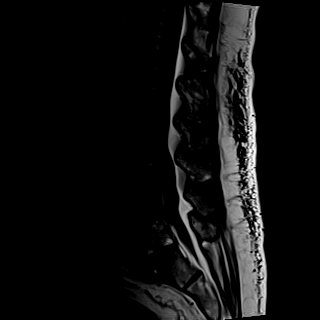
[im 15/15]
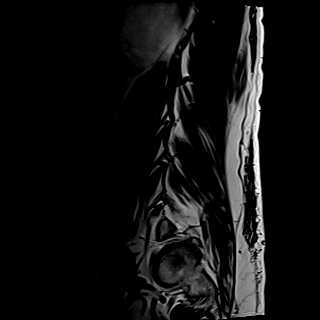

[Series 6: T2 · sagittal · 4.0mm · 0.81mm/px · 3 of 15 slices shown (1 of 2)]
[im 1/15]
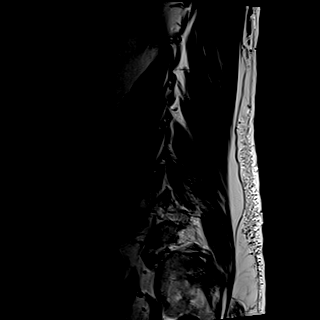
[im 8/15]
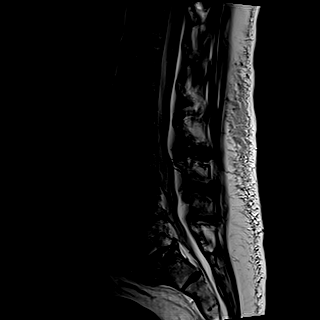
[im 15/15]
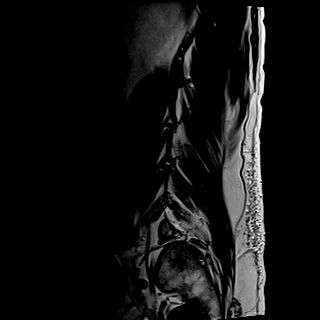

[Series 7: STIR · sagittal · 4.0mm · 0.51mm/px · 2 of 15 slices shown]
[im 1/15]
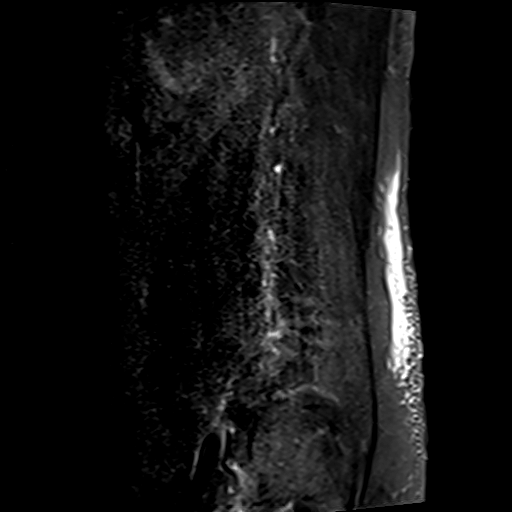
[im 8/15]
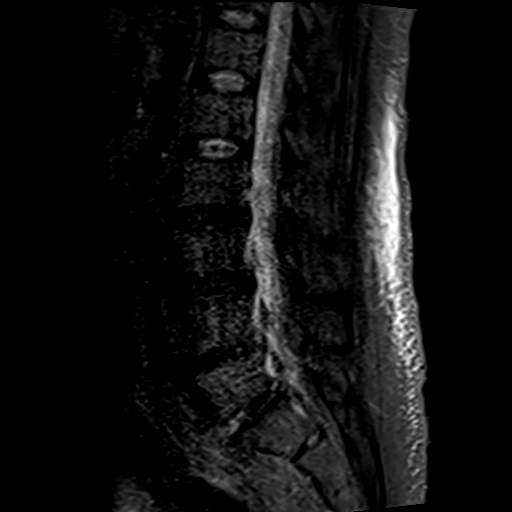

[Series 8: T2 · axial · 4.0mm · 0.62mm/px · z∈[-504,-273]mm · 8 of 48 slices shown (2 of 2)]
[im 1/48]
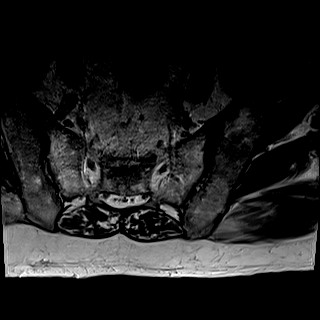
[im 6/48]
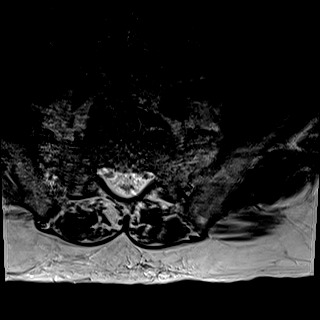
[im 16/48]
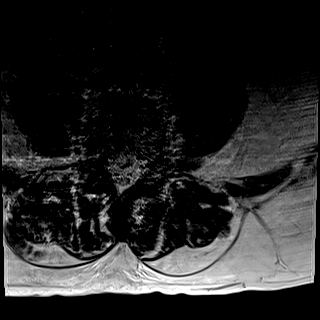
[im 21/48]
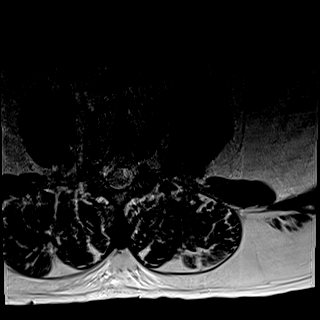
[im 27/48]
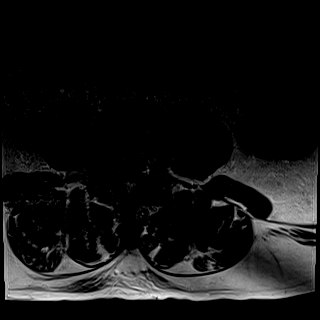
[im 32/48]
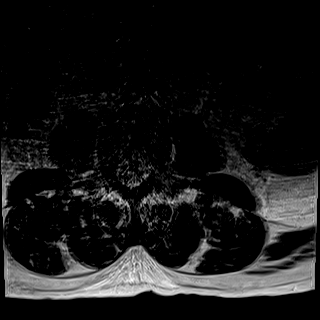
[im 42/48]
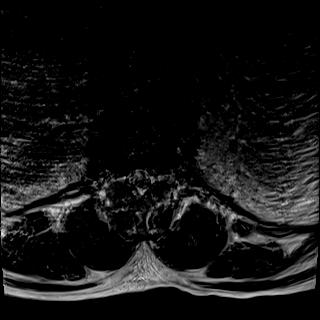
[im 48/48]
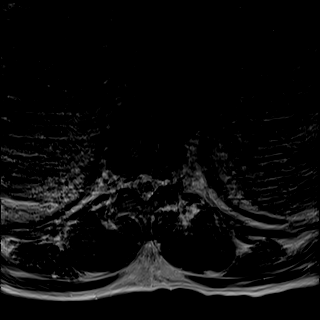

[Series 9: T1 · axial · 4.0mm · 0.39mm/px · z∈[-504,-273]mm · 8 of 48 slices shown (2 of 2)]
[im 1/48]
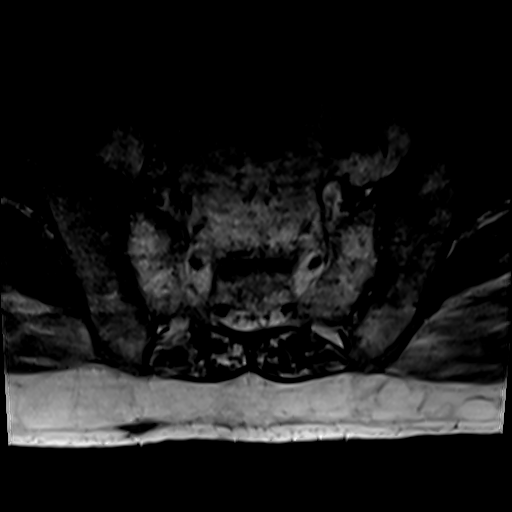
[im 6/48]
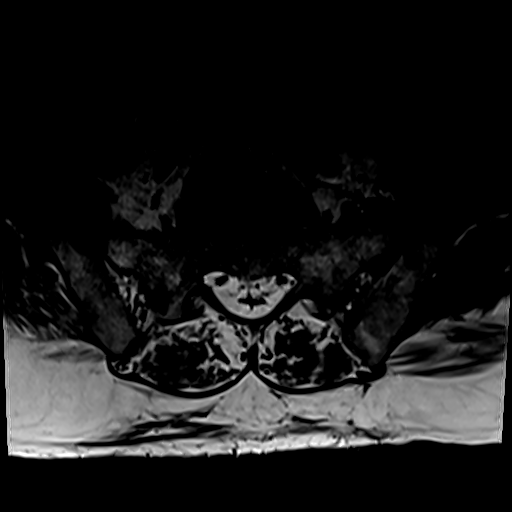
[im 16/48]
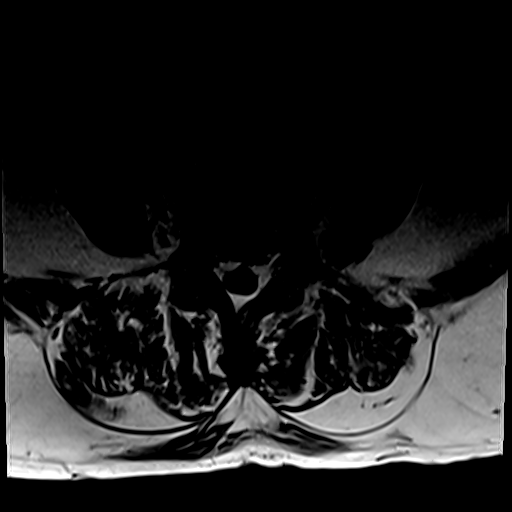
[im 21/48]
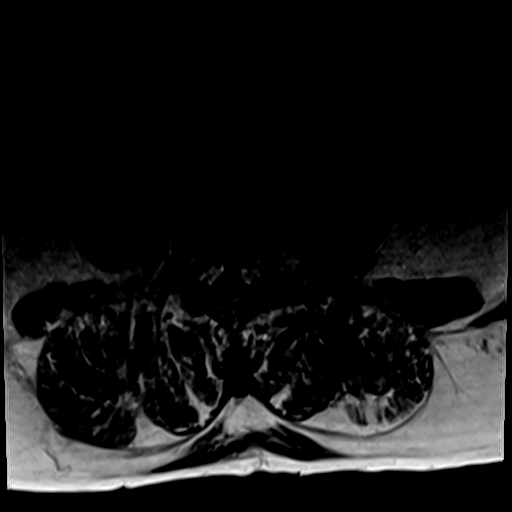
[im 27/48]
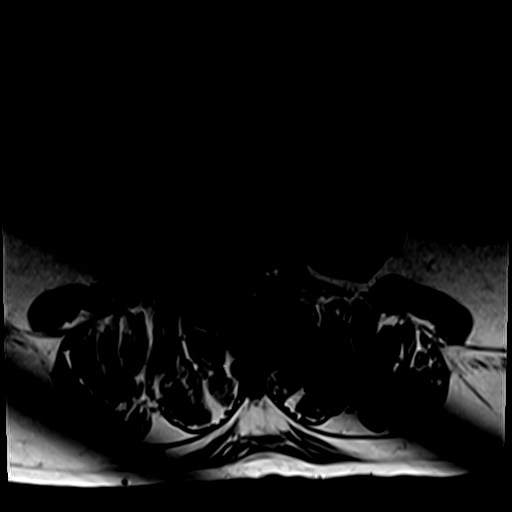
[im 32/48]
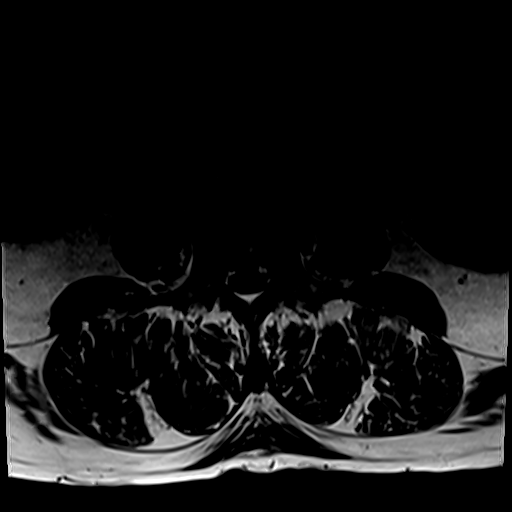
[im 42/48]
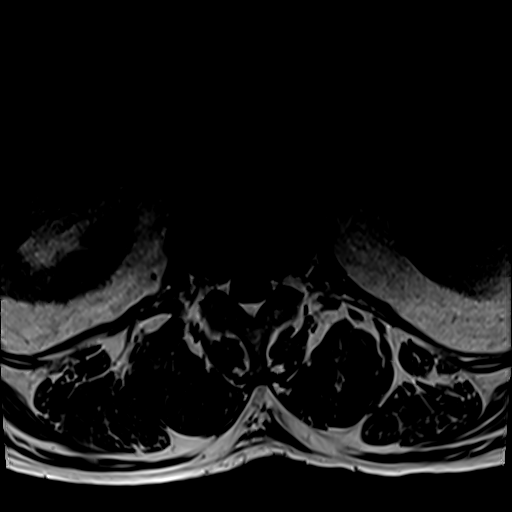
[im 48/48]
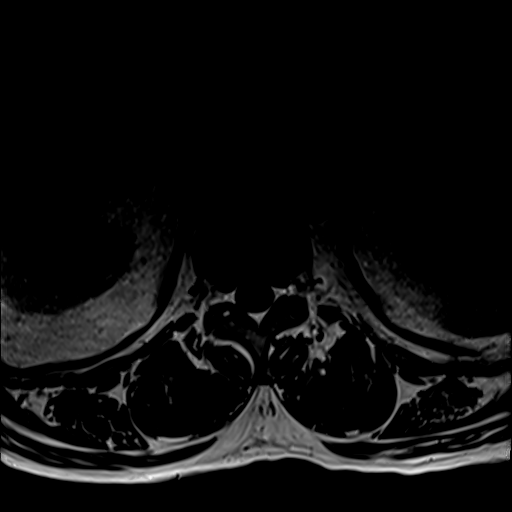

[Series 10: T1 fat-sat post-contrast · sagittal · 4.0mm · 0.81mm/px · 3 of 15 slices shown (1 of 2)]
[im 1/15]
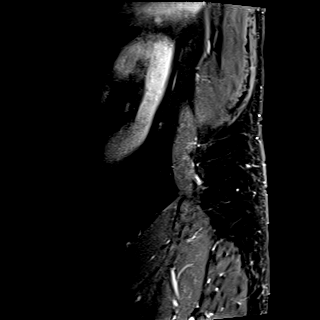
[im 8/15]
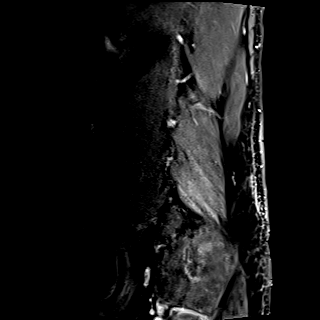
[im 15/15]
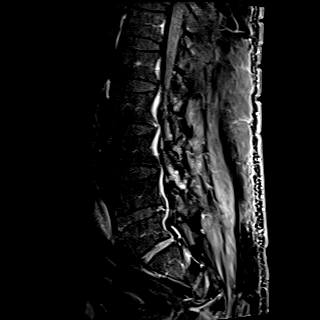

[Series 13: T1 fat-sat post-contrast · sagittal · 4.0mm · 0.81mm/px · 3 of 17 slices shown (2 of 2)]
[im 1/17]
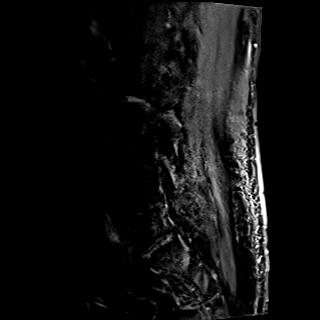
[im 9/17]
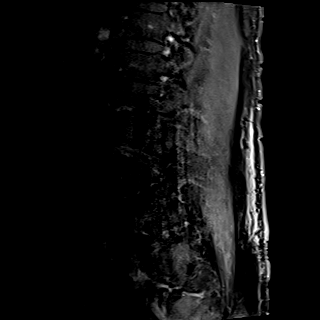
[im 17/17]
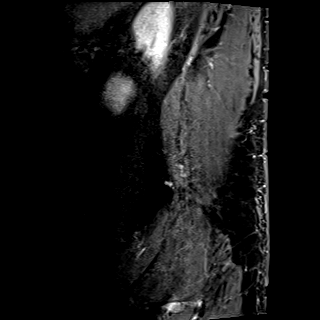

[30 of 48 positions shown; findings below may reference images not displayed]

FINDINGS: MRI THORACIC SPINE FINDINGS

Alignment:  Physiologic.

Vertebrae: No fracture, evidence of discitis, or bone lesion.

Cord:  Normal signal and morphology.

Paraspinal and other soft tissues: Negative.

Disc levels:

No spinal canal or neural foraminal stenosis. No epidural
collection.

MRI LUMBAR SPINE FINDINGS

Segmentation:  Standard.

Alignment:  Physiologic.

Vertebrae:  No fracture, evidence of discitis, or bone lesion.

Conus medullaris: Extends to the L1 level and appears normal.

Paraspinal and other soft tissues: Negative

Disc levels:

L1-L2: Normal disc space and facet joints. No spinal canal stenosis.
No neural foraminal stenosis.

L2-L3: Small disc bulge. Mild facet hypertrophy. Mild spinal canal
stenosis. No neural foraminal stenosis.

L3-L4: Disc desiccation. No herniation. No spinal canal stenosis. No
neural foraminal stenosis.

L4-L5: Small disc bulge with endplate spurring. No spinal canal
stenosis. Moderate right neural foraminal stenosis.

L5-S1: Disc space narrowing with small bulge and endplate spurring.
No spinal canal stenosis. No neural foraminal stenosis.

Visualized sacrum: Normal.
IMPRESSION: 1. No acute abnormality of the thoracic or lumbar spine.
2. Mild L2-L3 spinal canal stenosis.
3. Moderate right L4-L5 neural foraminal stenosis.
4. No spinal canal or neural foraminal stenosis at other levels.

## 2020-09-20 IMAGING — DX DG CHEST 1V PORT
1 series · 1 of 1 positions shown · non-contrast
Comparison: [DATE]

CLINICAL DATA: Fever

EXAM:
PORTABLE CHEST 1 VIEW

[chest ap]
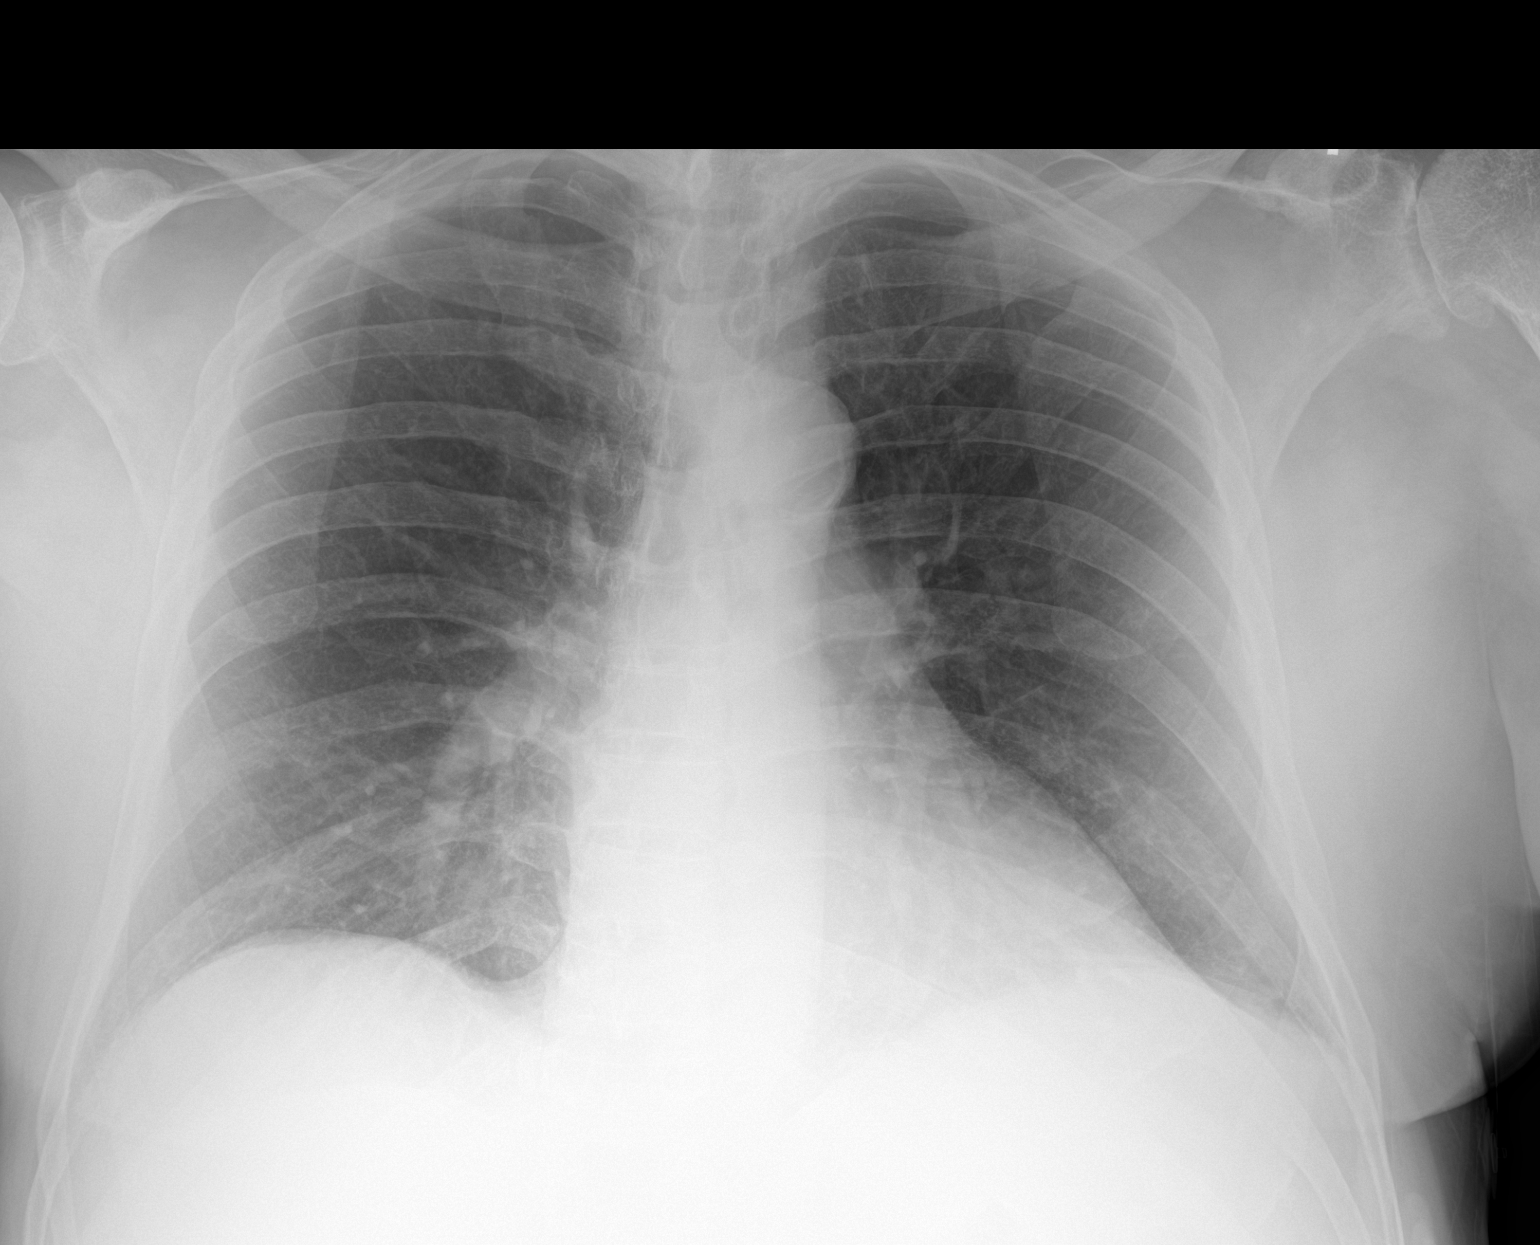

[1 of 1 positions shown; findings below may reference images not displayed]

FINDINGS: Heart and mediastinal contours are within normal limits. No focal
opacities or effusions. No acute bony abnormality.
IMPRESSION: No active disease.

## 2020-09-20 MED ORDER — CYCLOBENZAPRINE HCL 10 MG PO TABS
5.0000 mg | ORAL_TABLET | Freq: Once | ORAL | Status: AC
  Administered 2020-09-20: 5 mg via ORAL
  Filled 2020-09-20: qty 1

## 2020-09-20 MED ORDER — SODIUM CHLORIDE 0.9 % IV BOLUS
1000.0000 mL | Freq: Once | INTRAVENOUS | Status: AC
  Administered 2020-09-20: 1000 mL via INTRAVENOUS

## 2020-09-20 NOTE — Discharge Instructions (Addendum)
You were evaluated in the Emergency Department and after careful evaluation, we did not find any emergent condition requiring admission or further testing in the hospital.  Your exam/testing today was overall reassuring.  Recommend rest and over-the-counter Tylenol or Motrin for discomfort at home.  Symptoms seem to be due to a viral illness.  He tested negative for COVID, negative for flu.  Please return to the Emergency Department if you experience any worsening of your condition.  Thank you for allowing [place] to be a part of your care.

## 2020-09-20 NOTE — ED Notes (Signed)
Pt returned from MRI °

## 2020-09-20 NOTE — ED Notes (Signed)
Pt up to beside toilet with minimal assist

## 2020-09-21 LAB — URINE CULTURE

## 2020-09-22 ENCOUNTER — Telehealth (HOSPITAL_BASED_OUTPATIENT_CLINIC_OR_DEPARTMENT_OTHER): Payer: Self-pay | Admitting: Emergency Medicine

## 2020-09-24 LAB — CULTURE, BLOOD (ROUTINE X 2): Special Requests: ADEQUATE

## 2020-09-25 ENCOUNTER — Telehealth: Payer: Self-pay | Admitting: Emergency Medicine

## 2020-09-25 LAB — CULTURE, BLOOD (ROUTINE X 2): Culture: NO GROWTH

## 2020-09-25 NOTE — Telephone Encounter (Signed)
Post ED Visit - Positive Culture Follow-up  Culture report reviewed by antimicrobial stewardship pharmacist: [place] Team [x]  Elenor Quinones, Pharm.D. []  Heide Guile, Pharm.D., BCPS AQ-ID []  Parks Neptune, Pharm.D., BCPS []  Alycia Rossetti, Pharm.D., BCPS []  [place], Pharm.D., BCPS, AAHIVP []  Legrand Como, Pharm.D., BCPS, AAHIVP []  Salome Arnt, PharmD, BCPS []  Johnnette Gourd, PharmD, BCPS []  Hughes Better, PharmD, BCPS []  Leeroy Cha, PharmD []  Laqueta Linden, PharmD, BCPS []  Albertina Parr, PharmD  [place] Team []  Leodis Sias, PharmD []  Lindell Spar, PharmD []  Royetta Asal, PharmD []  Graylin Shiver, Rph []  Rema Fendt) Glennon Mac, PharmD []  Arlyn Dunning, PharmD []  Netta Cedars, PharmD []  Dia Sitter, PharmD []  Leone Haven, PharmD []  Gretta Arab, PharmD []  Theodis Shove, PharmD []  Peggyann Juba, PharmD []  Reuel Boom, PharmD   Positive blood culture Treated with none, probable contaminant and no further patient follow-up is required at this time.  Hazle Nordmann 09/25/2020, 9:40 AM

## 2020-10-01 ENCOUNTER — Encounter: Payer: Self-pay | Admitting: Gastroenterology

## 2020-10-30 ENCOUNTER — Other Ambulatory Visit: Payer: Self-pay

## 2020-11-01 ENCOUNTER — Ambulatory Visit (INDEPENDENT_AMBULATORY_CARE_PROVIDER_SITE_OTHER): Payer: No Typology Code available for payment source | Admitting: Gastroenterology

## 2020-11-01 ENCOUNTER — Encounter: Payer: Self-pay | Admitting: Gastroenterology

## 2020-11-01 ENCOUNTER — Other Ambulatory Visit (INDEPENDENT_AMBULATORY_CARE_PROVIDER_SITE_OTHER): Payer: No Typology Code available for payment source

## 2020-11-01 VITALS — BP 158/80 | HR 84 | Ht 72.0 in | Wt 218.4 lb

## 2020-11-01 DIAGNOSIS — K7030 Alcoholic cirrhosis of liver without ascites: Secondary | ICD-10-CM

## 2020-11-01 DIAGNOSIS — Z86010 Personal history of colonic polyps: Secondary | ICD-10-CM

## 2020-11-01 DIAGNOSIS — K529 Noninfective gastroenteritis and colitis, unspecified: Secondary | ICD-10-CM

## 2020-11-01 DIAGNOSIS — K219 Gastro-esophageal reflux disease without esophagitis: Secondary | ICD-10-CM

## 2020-11-01 DIAGNOSIS — R1084 Generalized abdominal pain: Secondary | ICD-10-CM | POA: Diagnosis not present

## 2020-11-01 DIAGNOSIS — R110 Nausea: Secondary | ICD-10-CM

## 2020-11-01 LAB — CBC WITH DIFFERENTIAL/PLATELET
Basophils Absolute: 0 10*3/uL (ref 0.0–0.1)
Basophils Relative: 0.3 % (ref 0.0–3.0)
Eosinophils Absolute: 0 10*3/uL (ref 0.0–0.7)
Eosinophils Relative: 0.5 % (ref 0.0–5.0)
HCT: 49.1 % (ref 39.0–52.0)
Hemoglobin: 16.8 g/dL (ref 13.0–17.0)
Lymphocytes Relative: 32.1 % (ref 12.0–46.0)
Lymphs Abs: 1.5 10*3/uL (ref 0.7–4.0)
MCHC: 34.2 g/dL (ref 30.0–36.0)
MCV: 100.8 fl — ABNORMAL HIGH (ref 78.0–100.0)
Monocytes Absolute: 0.4 10*3/uL (ref 0.1–1.0)
Monocytes Relative: 7.8 % (ref 3.0–12.0)
Neutro Abs: 2.8 10*3/uL (ref 1.4–7.7)
Neutrophils Relative %: 59.3 % (ref 43.0–77.0)
Platelets: 105 10*3/uL — ABNORMAL LOW (ref 150.0–400.0)
RBC: 4.88 Mil/uL (ref 4.22–5.81)
RDW: 13.8 % (ref 11.5–15.5)
WBC: 4.7 10*3/uL (ref 4.0–10.5)

## 2020-11-01 LAB — COMPREHENSIVE METABOLIC PANEL
ALT: 24 U/L (ref 0–53)
AST: 37 U/L (ref 0–37)
Albumin: 4.1 g/dL (ref 3.5–5.2)
Alkaline Phosphatase: 113 U/L (ref 39–117)
BUN: 9 mg/dL (ref 6–23)
CO2: 25 mEq/L (ref 19–32)
Calcium: 9.5 mg/dL (ref 8.4–10.5)
Chloride: 101 mEq/L (ref 96–112)
Creatinine, Ser: 0.76 mg/dL (ref 0.40–1.50)
GFR: 87.73 mL/min (ref >60.00)
Glucose, Bld: 130 mg/dL — ABNORMAL HIGH (ref 70–99)
Potassium: 3.9 mEq/L (ref 3.5–5.1)
Sodium: 137 mEq/L (ref 135–145)
Total Bilirubin: 1.7 mg/dL — ABNORMAL HIGH (ref 0.2–1.2)
Total Protein: 8.5 g/dL — ABNORMAL HIGH (ref 6.0–8.3)

## 2020-11-01 LAB — PROTIME-INR
INR: 1.2 ratio — ABNORMAL HIGH (ref 0.8–1.0)
Prothrombin Time: 12.9 s (ref 9.6–13.1)

## 2020-11-01 MED ORDER — PEG 3350-KCL-NA BICARB-NACL 420 G PO SOLR: 4000.0000 mL | Freq: Once | ORAL | 0 refills | Status: AC

## 2020-11-01 NOTE — Patient Instructions (Signed)
If you are age 76 or older, your body mass index should be between 23-30. Your Body mass index is 29.62 kg/m. If this is out of the aforementioned range listed, please consider follow up with your Primary Care Provider.  If you are age 43 or younger, your body mass index should be between 19-25. Your Body mass index is 29.62 kg/m. If this is out of the aformentioned range listed, please consider follow up with your Primary Care Provider.   __________________________________________________________  The [place] [facility] providers would like to encourage you to use [facility] to communicate with providers for non-urgent requests or questions.  Due to long hold times on the telephone, sending your provider a message by [facility] may be a faster and more efficient way to get a response.  Please allow 48 business hours for a response.  Please remember that this is for non-urgent requests.   You have been scheduled for an endoscopy and colonoscopy. Please follow the written instructions given to you at your visit today. Please pick up your prep supplies at the pharmacy within the next 1-3 days. If you use inhalers (even only as needed), please bring them with you on the day of your procedure.  Your provider has requested that you go to the basement level for lab work before leaving today. Press "B" on the elevator. The lab is located at the first door on the left as you exit the elevator.  Due to recent changes in healthcare laws, you may see the results of your imaging and laboratory studies on MyChart before your provider has had a chance to review them.  We understand that in some cases there may be results that are confusing or concerning to you. Not all laboratory results come back in the same time frame and the provider may be waiting for multiple results in order to interpret others.  Please give [place] 48 hours in order for your provider to thoroughly review all the results before contacting the office for  clarification of your results.    It was a pleasure to see you today!  Thank you for trusting me with your gastrointestinal care!

## 2020-11-01 NOTE — Progress Notes (Signed)
[facility] Consult Note:  History: Don Norman 11/01/2020  Referring provider: [place], [place]  Reason for consult/chief complaint: Abdominal Pain (Complains of periumbilical abdominal pain "achy" in nature recently. +bloating), Diarrhea (Intermittent diarrhea; patient denies any blood in stool. No rectal pain), and Vomiting (Patient indicates that he has reflux at times which leads to vomiting. Does note some hematemesis at times.)   Subjective  HPI: This patient was seen at the request of a local outpatient [place] clinic, community care arrangement as closer to home.  Referral indicates request for surveillance colonoscopy with history of colon polyp.  No clinic notes or prior endoscopic pathology records accompany the referral.  However, imaging reports accompany this referral given clinical history of "cirrhosis".  See imaging report description below  Don Norman was here with his wife today, and she assists with the history.  He describes some vague abdominal discomfort that is intermittent, [place] locations and usually brief.  He cannot say if there are clear triggers or relieving factors.  Overall, he is a limited historian.  He and his wife both say he has had at least months if not years of frequent cramps and diarrhea, often soon after eating.  He denies rectal bleeding, but he has severe reflux with regurgitation and pyrosis and intermittent nausea and vomiting. He drinks alcohol heavily, does not quantify, but his wife says "a lot, he sits and drinks all day".  ROS:  Review of Systems  Constitutional:  Negative for appetite change and unexpected weight change.  HENT:  Negative for mouth sores and voice change.   Eyes:  Negative for pain and redness.  Respiratory:  Negative for cough and shortness of breath.   Cardiovascular:  Positive for leg swelling. Negative for chest pain and palpitations.  Genitourinary:  Negative for dysuria and  hematuria.  Musculoskeletal:  Negative for arthralgias and myalgias.  Skin:  Negative for pallor and rash.  Neurological:  Negative for weakness and headaches.  Hematological:  Negative for adenopathy.    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    GERD (gastroesophageal reflux disease)    Heart disease    Hypertension    Prediabetes    PTSD (post-traumatic stress disorder)    Renal disorder    Sleep apnea      Past Surgical History: Past Surgical History:  Procedure Laterality Date   COLONOSCOPY  2017     Family History: Family History  Problem Relation Age of Onset   Cancer Maternal Uncle        x 4; unknown type   Colon cancer Neg Hx    Esophageal cancer Neg Hx    Pancreatic cancer Neg Hx    Stomach cancer Neg Hx    Liver disease Neg Hx     Social History: Social History   Socioeconomic History   Marital status: Married    Spouse name: Not on file   Number of children: Not on file   Years of education: Not on file   Highest education level: Not on file  Occupational History   Not on file  Tobacco Use   Smoking status: Former   Smokeless tobacco: Never  Vaping Use   Vaping Use: Never used  Substance and Sexual Activity   Alcohol use: Yes    Comment: drinks 1/5 in 2 days  Drug use: No   Sexual activity: Not on file  Other Topics Concern   Not on file  Social History Narrative   Not on file   Social Determinants of Health   Financial Resource Strain: Not on file  Food Insecurity: Not on file  Transportation Needs: Not on file  Physical Activity: Not on file  Stress: Not on file  Social Connections: Not on file    Allergies: Allergies  Allergen Reactions   Lisinopril     Outpatient Meds: Current Outpatient Medications  Medication Sig Dispense Refill   acetaminophen (TYLENOL) 325 MG tablet TAKE THREE TABLETS BY MOUTH  EVERY 8 HOURS AS NEEDED FOR PAIN OR FEVER EACH TABLET CONTAINS '325MG'$  ACETAMINOPHEN (TYLENOL,APAP). MAXIMUM DAILY RECOMMENDED DOSE IS '3000MG'$      aspirin EC 81 MG tablet Take 81 mg by mouth daily.     benzonatate (TESSALON) 200 MG capsule TAKE ONE CAPSULE BY MOUTH 3 TIMES A DAY AS NEEDED SWALLOW WHOLE DO NOT CUT FOR COUGH COUGH     cetirizine (ZYRTEC) 10 MG tablet Take 1 tablet by mouth daily.     clotrimazole-betamethasone (LOTRISONE) cream Apply to affected area 2 times a day for one week only 15 g 0   diclofenac Sodium (VOLTAREN) 1 % GEL APPLY 2 GRAMS TOPICALLY FOUR TIMES A DAY     doxycycline (VIBRAMYCIN) 100 MG capsule Take 100 mg by mouth 2 (two) times daily.     fluticasone (FLONASE) 50 MCG/ACT nasal spray INSTILL 1 SPRAY INTO EACH NOSTRIL EVERY DAY MAXIMUM 2 SPRAYS IN EACH NOSTRIL DAILY. FOR ALLERGIES     gabapentin (NEURONTIN) 100 MG capsule TAKE ONE CAPSULE BY MOUTH THREE TIMES A DAY FOR 7 DAYS, THEN TAKE TWO CAPSULES THREE TIMES A DAY FOR 7 DAYS, THEN TAKE THREE CAPSULES THREE TIMES A DAY FOR NERVE PAIN     guaiFENesin (MUCINEX) 600 MG 12 hr tablet Take 1 tablet by mouth every 12 (twelve) hours as needed.     HYDROcodone-acetaminophen (NORCO/VICODIN) 5-325 MG tablet Take 1 tablet by mouth every 6 (six) hours as needed.     lidocaine (XYLOCAINE) 5 % ointment APPLY MODERATE AMOUNT TOPICALLY FOUR TIMES A DAY AS NEEDED APPLY TO SHOULDER FOR PAIN     Menthol-Methyl Salicylate (THERA-GESIC) 0.5-15 % CREA APPLY MODERATE AMOUNT TOPICALLY 3 TIMES A DAY AS NEEDED FOR PAIN ( AVOID FACE and EYES, WASH HANDS AFTER USING).  APPLY TO SHOULDER  DO NOT APPLY DIRECTLY AFTER SHOWERING FOR PAIN ( AVOID FACE and EYES, [place] HANDS AFTER USING).  APPLY TO SHOULDER  DO NOT APPLY DIRECTLY AFTER SHOWERING     omeprazole (PRILOSEC) 40 MG capsule TAKE ONE CAPSULE BY MOUTH TWO TIMES A DAY TO CONTROL STOMACH ACID.  TAKE 30 MINUTES BEFORE A MEAL     ondansetron (ZOFRAN ODT) 4 MG disintegrating tablet Take 1 tablet (4 mg  total) by mouth every 8 (eight) hours as needed for nausea or vomiting. 20 tablet 0   polyethylene glycol-electrolytes (NULYTELY) 420 g solution Take 4,000 mLs by mouth once for 1 dose. 4000 mL 0   sucralfate (CARAFATE) 1 g tablet Take 1 tablet (1 g total) by mouth 4 (four) times daily -  with meals and at bedtime. 60 tablet 0   tamsulosin (FLOMAX) 0.4 MG CAPS capsule Take 1 capsule by mouth daily.     No current facility-administered medications for this visit.      ___________________________________________________________________ Objective   Exam:  BP (!) 158/80   Pulse 84  Ht 6' (1.829 m)   Wt 218 lb 6.4 oz (99.1 kg)   BMI 29.62 kg/m  Wt Readings from Last 3 Encounters:  11/01/20 218 lb 6.4 oz (99.1 kg)  09/19/20 211 lb (95.7 kg)  07/23/20 214 lb (97.1 kg)    General: Not acutely ill-appearing.  Ambulatory, normal mental status, steady gait. Eyes: sclera anicteric, no redness ENT: oral mucosa moist without lesions, no cervical or supraclavicular lymphadenopathy.  Partial on the bottom.  Bridge on the top held in by 2 teeth that are in poor repair, 1 in the left is loose.  I got a plastic bite block from our endoscopy lab and make sure that it fits in that space without disturbing or dislodging either the remaining top teeth. CV: RRR without murmur, S1/S2, no JVD, bilateral mild pretibial edema Resp: clear to auscultation bilaterally, normal RR and effort noted GI: soft, obese, no tenderness, with active bowel sounds. No guarding or palpable organomegaly noted, though limited by body habitus.  Nonreducible umbilical hernia Skin; warm and dry, no rash or jaundice noted Neuro: awake, alert and oriented x 3. Normal gross motor function and fluent speech No asterixis Labs:   laboratory studies accompanying this [facility] are from April 2022  alkaline phosphatase 122, ALT 24, AST 25, total bilirubin 1.27 September 2019 albumin was 2.3, hepatitis a IgM antibody negative,  hepatitis B core antibody negative, hep B surface antibody positive, surface antigen negative Sodium 134, chloride 101, calcium 9.2, BUN 7, creatinine 0.8  INR 1.37  Also from 06/26/2020, hematocrit 47, platelet 102   Radiologic Studies:  Abdominal ultrasound 05/16/2020 from [place] [facility]ology done for clinical history of cirrhosis" [place] screening"  Liver described as heterogeneous and coarsened in echotexture with no focal hepatic lesions.  Normal portal flow Gallbladder described is unremarkable with no gallstones.  Gallbladder wall 3 mm with no pericholecystic fluid.  CBD 9 mm, reportedly stable compared to July 2021.  No intrahepatic biliary ductal dilatation Bilateral kidneys normal Visualized portions of pancreas normal.  Spleen normal size No specific mention of the presence or absence of ascites.  Assessment: Encounter Diagnoses  Name Primary?   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  No splenomegaly noted on ultrasound, no ascites noted.  Does not sound like he has ever had upper endoscopy to screen for varices or to evaluate his upper digestive symptoms. Chronic diarrhea, cause unclear.  Reported history of colon polyps, no report of that but he and his wife say he had 6 polyps on the colonoscopy at the [place] [facility] 4 years ago.  I made it clear in no uncertain terms that he needs help to become abstinent from alcohol because his liver disease will otherwise worsen and further decompensate.  Needs to be screened for esophageal varices, currently not clinically encephalopathic, no ascites on imaging.  Plan: CBC, CMP and PT/INR today to update meld score and also for preprocedure  bleeding risk assessment.  Upper endoscopy to evaluate upper digestive symptoms and screen for esophageal varices  Colonoscopy for colon polyp surveillance and evaluate diarrhea.  He was agreeable after discussion of the procedures and risks.  The benefits and risks of the  planned procedure were described in detail with the patient or (when appropriate) their health care proxy.  Risks were outlined as including, but not limited to, bleeding, infection, perforation, adverse medication reaction leading to cardiac or pulmonary decompensation, pancreatitis (if ERCP).  The limitation of incomplete mucosal visualization was also discussed.  No guarantees or warranties were given.  Patient at increased risk for cardiopulmonary complications of procedure due to medical comorbidities.  After his endoscopic procedures, he will need [facility]-term management of his cirrhosis.  He and his wife live in this area, and I suggested they inquire about being seen at the [place] clinic since it is closer to home.  Thank you for the courtesy of this consult.  Please call me with any questions or concerns.  Nelida Meuse III  CC: Referring provider noted above

## 2020-11-08 ENCOUNTER — Telehealth: Payer: Self-pay | Admitting: Gastroenterology

## 2020-11-08 ENCOUNTER — Other Ambulatory Visit: Payer: Self-pay

## 2020-11-08 MED ORDER — GOLYTELY 236 G PO SOLR: 4000.0000 mL | Freq: Once | ORAL | 0 refills | Status: AC

## 2020-11-08 NOTE — Telephone Encounter (Signed)
Pt's wife called to inform that prep needs to be sent to the [place] in [place] not the pt's pharmacy.

## 2020-11-08 NOTE — Telephone Encounter (Signed)
Rx has been sent to [facility]

## 2020-12-04 ENCOUNTER — Telehealth: Payer: Self-pay | Admitting: Gastroenterology

## 2020-12-04 NOTE — Telephone Encounter (Signed)
Spoke with patient's wife, she states that patient sees Dr. Ronnald Ramp at [facility] in [place]. She states that he saw them last month. Advised that we will have to request the records for review to ensure that it is safe for hom to have his procedures in our endoscopy center. Advised that if we need to make any changes to his appts we will let them know. Patient verbalized understanding and had no concerns at the end of the call.

## 2020-12-04 NOTE — Telephone Encounter (Signed)
Spoke with receptionist at [facility] - Dr. Meredith Staggers office [phone]). I was told that patient's nurse, Lelan Pons left at 12 pm today and will be back tomorrow. Will attempt again tomorrow.

## 2020-12-04 NOTE — Telephone Encounter (Signed)
Thank you for working on that, it is unfortunate that records are difficult to receive from the [place], even though this patient was referred to [place] by the [place] [facility] clinic.  Please contact the patient's wife and inform them that the upcoming [place] procedures must be canceled, and give the reason of more information needed regarding Billye's cardiac condition.  Additional options to give this patient and his wife are:  This patient can ask his PCP for an office visit with them soon, have cardiac info included, and send [place] the note.  This patient can ask his [place] provider to perform his procedures at the [place] because they have all his records.  They sent him to [place] because we are closer to his home, but that is now turning out to possibly note be the most expedient way for him to get procedures done.  - HD

## 2020-12-04 NOTE — Telephone Encounter (Signed)
Received a voicemail from [place] at the [place] PCP office she asked that I return call to her at [phone]. I returned the call and was told that they do not have a Leah at this office. A message was sent to Eduard Clos, Therapist, sports covering for [employer].   Spoke with Eduard Clos, RN at length trying to find more information. Eduard Clos states that patient's first echo was 09/20/2007 and the next was in 11/2008. Patient had a subsequent OV on 02/21/2021. He states that patient does not have a cardiologist. Eduard Clos states that he is unable to send me this information via fax until he gets a release form signed by the patient. I have offered to send the request on our letter head as well to expedite this process. Eduard Clos states that he can't release the information without the patient's signature. He states that he is going to reach out to the patient to get this completed, advised that patient lives in [place]. He states that he can mail the release to the patient and proceed once it has been received back in the office. Advised that I am not sure if this would happen before Tuesday, may be best to move procedures. Advised that I will get this information to Dr. Loletha Carrow so that he is aware.

## 2020-12-04 NOTE — Telephone Encounter (Signed)
[place],  This patient was recently seen in clinic and is scheduled for an EGD and colonoscopy with me in the [facility] on 12/11/2020  I received a note from Osvaldo Angst, CRNA after his chart review noting history of cardiomyopathy but few outpatient records available from the  [facility] to give sufficient clinical details and, in particular, last echocardiogram report or cardiology office note in order to perform risk assessment and patient's candidacy for procedures in the [place].  Please attempt to obtain the last echocardiogram report on this patient from the [facility] for review by Osvaldo Angst and me (or DOD if needed).  If that cannot be achieved by 12/07/2020 (this Friday), the patient's [place] procedures must be canceled to give [place] more time to obtain this information or perhaps move patient's procedures to a later [place] date.  - HD

## 2020-12-05 NOTE — Telephone Encounter (Signed)
Spoke with patient's wife in regards to recommendations from Dr. Loletha Carrow. Wife does not want to cancel procedures. I have explained to her that we need updated records with cardiac assessment sent [place] for review. Advised that if we do not have this information by Friday we will have to cancel patient's upcoming procedures. Advised that they would not fax [place] the records without having a signed release from the patient. Advised that I was told to try and reach out to the [facility] care [phone]) at the [facility] since they referred the patient, the wife states that he was referred by PCP.  Patient' wife states that she will call the [facility] and see if they will give her the information.

## 2020-12-05 NOTE — Telephone Encounter (Signed)
Received a vm from [place] at the community care office at the [place]. Don Norman asked that I give her a call back to discuss my concerns([phone], ext. [phone]).  Attempted to return call to [facility] twice, no prompt to leave a vm. Automated message states that office hours are 8-4:30 pm. Will attempt Don Norman again tomorrow morning.

## 2020-12-05 NOTE — Telephone Encounter (Signed)
Received a vm from [place], Therapist, sports at [facility] asking for a return call. Returned the call to [place] and he is with a patient at this time and will have to call me back.

## 2020-12-07 NOTE — Telephone Encounter (Signed)
Attempted to reach Don Norman twice again at her provided extension, no option to leave a vm. Attempted to reach the [facility] office [phone]) as well and no answer. I have spoken to patient's wife advised that we will have to cancel the upcoming procedures and wait until we have notes. Wife will try to reach out to [place], advised her to let them know that we have tried to reach them several times. Pt's wife verbalized understanding.

## 2020-12-11 ENCOUNTER — Encounter: Payer: No Typology Code available for payment source | Admitting: Gastroenterology

## 2020-12-11 NOTE — Telephone Encounter (Signed)
Records received from the [place]. Placed in your IN box for review. Thanks

## 2020-12-11 NOTE — Telephone Encounter (Signed)
Don Norman,  Please see the message string below.  [facility] lists cardiomyopathy with his active problems, though no specific active management of it during that visit.  Echocardiogram on 11/30/2008 at the [facility] shows mild LVH, LVEF over 48%, grade 1 diastolic dysfunction.  That is all the available information regarding this patient's cardiac condition.  Please let us know how you feel we should proceed.  Madelon Lips, MD

## 2020-12-11 NOTE — Telephone Encounter (Signed)
Received a call from [facility], she states that she has been out of the office for the last few days. We have discussed what was needed for our office to proceed in getting patient rescheduled. Lynnette states that cardiomyopathy was an old diagnoses, she states that they did not feel like patient needed to be followed by a cardiologist for this so there is minimal documentation. Lynnette states that she has faxed over the last echocardiogram from 11/2008 for the provider to review. Advised that once the provider reviews he will further advise. I told Lynnette that I will call her back if I need any additional information. [phone], ext. [phone]). Will await report.

## 2020-12-17 NOTE — Telephone Encounter (Signed)
Spoke with patient's wife in regards to information. Patient's wife requested a morning appt, pt's EGD/colon has been rescheduled to Wednesday, 01/30/21 at 8 am in the [facility]. Pt's wife is aware that they will need to arrive on the 4th floor by 7 am. Advised that I will place new instructions in the mail. She states that prep does not expire until 10/2021. Pt's wife had no concerns at the end of the call.

## 2020-12-17 NOTE — Telephone Encounter (Signed)
[place],    Please contact this patient's wife and reschedule his EGD and colonoscopy in the [place].    ([place] previsit not needed.  Patient needs to be given new date and time and adjusted times for the prep instructions he already had.  Should already have his prep solution)  - HD

## 2020-12-17 NOTE — Telephone Encounter (Signed)
Dr. Loletha Carrow,  This pt is cleared for anesthetic care at [facility].   Thanks,  Osvaldo Angst

## 2021-01-30 ENCOUNTER — Encounter: Payer: Self-pay | Admitting: Gastroenterology

## 2021-01-30 ENCOUNTER — Ambulatory Visit (AMBULATORY_SURGERY_CENTER): Payer: No Typology Code available for payment source | Admitting: Gastroenterology

## 2021-01-30 ENCOUNTER — Other Ambulatory Visit: Payer: Self-pay

## 2021-01-30 VITALS — BP 154/93 | HR 83 | Temp 97.2°F | Resp 22 | Ht 72.0 in | Wt 218.0 lb

## 2021-01-30 DIAGNOSIS — K648 Other hemorrhoids: Secondary | ICD-10-CM

## 2021-01-30 DIAGNOSIS — D124 Benign neoplasm of descending colon: Secondary | ICD-10-CM

## 2021-01-30 DIAGNOSIS — D120 Benign neoplasm of cecum: Secondary | ICD-10-CM | POA: Diagnosis not present

## 2021-01-30 DIAGNOSIS — K5730 Diverticulosis of large intestine without perforation or abscess without bleeding: Secondary | ICD-10-CM | POA: Diagnosis not present

## 2021-01-30 DIAGNOSIS — Z860100 Personal history of colon polyps, unspecified: Secondary | ICD-10-CM

## 2021-01-30 DIAGNOSIS — K269 Duodenal ulcer, unspecified as acute or chronic, without hemorrhage or perforation: Secondary | ICD-10-CM

## 2021-01-30 DIAGNOSIS — K529 Noninfective gastroenteritis and colitis, unspecified: Secondary | ICD-10-CM | POA: Diagnosis not present

## 2021-01-30 DIAGNOSIS — D123 Benign neoplasm of transverse colon: Secondary | ICD-10-CM | POA: Diagnosis not present

## 2021-01-30 DIAGNOSIS — K2970 Gastritis, unspecified, without bleeding: Secondary | ICD-10-CM

## 2021-01-30 DIAGNOSIS — K319 Disease of stomach and duodenum, unspecified: Secondary | ICD-10-CM | POA: Diagnosis not present

## 2021-01-30 DIAGNOSIS — R112 Nausea with vomiting, unspecified: Secondary | ICD-10-CM

## 2021-01-30 DIAGNOSIS — K7030 Alcoholic cirrhosis of liver without ascites: Secondary | ICD-10-CM

## 2021-01-30 DIAGNOSIS — I8510 Secondary esophageal varices without bleeding: Secondary | ICD-10-CM

## 2021-01-30 DIAGNOSIS — D128 Benign neoplasm of rectum: Secondary | ICD-10-CM

## 2021-01-30 DIAGNOSIS — D122 Benign neoplasm of ascending colon: Secondary | ICD-10-CM

## 2021-01-30 MED ORDER — SODIUM CHLORIDE 0.9 % IV SOLN: 500.0000 mL | Freq: Once | INTRAVENOUS | Status: DC

## 2021-01-30 NOTE — Progress Notes (Signed)
Called to room to assist during endoscopic procedure.  Patient ID and intended procedure confirmed with present staff. Received instructions for my participation in the procedure from the performing physician.  

## 2021-01-30 NOTE — Patient Instructions (Signed)
Handout on polyps and diverticulosis given.    YOU HAD AN ENDOSCOPIC PROCEDURE TODAY AT [facility]:   Refer to the procedure report that was given to you for any specific questions about what was found during the examination.  If the procedure report does not answer your questions, please call your gastroenterologist to clarify.  If you requested that your care partner not be given the details of your procedure findings, then the procedure report has been included in a sealed envelope for you to review at your convenience later.  YOU SHOULD EXPECT: Some feelings of bloating in the abdomen. Passage of more gas than usual.  Walking can help get rid of the air that was put into your GI tract during the procedure and reduce the bloating. If you had a lower endoscopy (such as a colonoscopy or flexible sigmoidoscopy) you may notice spotting of blood in your stool or on the toilet paper. If you underwent a bowel prep for your procedure, you may not have a normal bowel movement for a few days.  Please Note:  You might notice some irritation and congestion in your nose or some drainage.  This is from the oxygen used during your procedure.  There is no need for concern and it should clear up in a day or so.  SYMPTOMS TO REPORT IMMEDIATELY:  Following lower endoscopy (colonoscopy or flexible sigmoidoscopy):  Excessive amounts of blood in the stool  Significant tenderness or worsening of abdominal pains  Swelling of the abdomen that is new, acute  Fever of 100F or higher  Following upper endoscopy (EGD)  Vomiting of blood or coffee ground material  New chest pain or pain under the shoulder blades  Painful or persistently difficult swallowing  New shortness of breath  Fever of 100F or higher  Black, tarry-looking stools  For urgent or emergent issues, a gastroenterologist can be reached at any hour by calling [phone]. Do not use MyChart messaging for urgent concerns.     DIET:  We do recommend a small meal at first, but then you may proceed to your regular diet.  Drink plenty of fluids but you should avoid alcoholic beverages for 24 hours.  ACTIVITY:  You should plan to take it easy for the rest of today and you should NOT DRIVE or use heavy machinery until tomorrow (because of the sedation medicines used during the test).    FOLLOW UP: Our staff will call the number listed on your records 48-72 hours following your procedure to check on you and address any questions or concerns that you may have regarding the information given to you following your procedure. If we do not reach you, we will leave a message.  We will attempt to reach you two times.  During this call, we will ask if you have developed any symptoms of COVID 19. If you develop any symptoms (ie: fever, flu-like symptoms, shortness of breath, cough etc.) before then, please call [phone].  If you test positive for Covid 19 in the 2 weeks post procedure, please call and report this information to [place].    If any biopsies were taken you will be contacted by phone or by letter within the next 1-3 weeks.  Please call us at [phone] if you have not heard about the biopsies in 3 weeks.    SIGNATURES/CONFIDENTIALITY: You and/or your care partner have signed paperwork which will be entered into your electronic medical record.  These signatures attest to the  fact that that the information above on your After Visit Summary has been reviewed and is understood.  Full responsibility of the confidentiality of this discharge information lies with you and/or your care-partner.

## 2021-01-30 NOTE — Progress Notes (Signed)
Pt in recovery with monitors in place, VSS. Report given to receiving RN. Bite guard was placed with pt awake to ensure comfort. No dental or soft tissue damage noted. 

## 2021-01-30 NOTE — Op Note (Signed)
[facility] Patient Name: Abdulahad Mederos Procedure Date: 01/30/2021 7:29 AM MRN: [id number] Endoscopist: Mallie Mussel L. Loletha Carrow , MD Age: 75 Referring MD:  Date of Birth: Apr 24, 1945 Gender: Male Account #: [id number] Procedure:                Colonoscopy Indications:              Surveillance: Personal history of colonic polyps                            (unknown histology) on last colonoscopy more than 5                            years ago, Incidental diarrhea noted Medicines:                Monitored Anesthesia Care Procedure:                Pre-Anesthesia Assessment:                           - Prior to the procedure, a History and Physical                            was performed, and patient medications and                            allergies were reviewed. The patient's tolerance of                            previous anesthesia was also reviewed. The risks                            and benefits of the procedure and the sedation                            options and risks were discussed with the patient.                            All questions were answered, and informed consent                            was obtained. Prior Anticoagulants: The patient has                            taken no previous anticoagulant or antiplatelet                            agents. ASA Grade Assessment: III - A patient with                            severe systemic disease. After reviewing the risks                            and benefits, the patient was deemed in  satisfactory condition to undergo the procedure.                           After obtaining informed consent, the colonoscope                            was passed under direct vision. Throughout the                            procedure, the patient's blood pressure, pulse, and                            oxygen saturations were monitored continuously. The                            Colonoscope was  introduced through the anus and                            advanced to the the cecum, identified by                            appendiceal orifice and ileocecal valve. The                            colonoscopy was performed without difficulty. The                            patient tolerated the procedure fairly well. The                            quality of the bowel preparation was good. The                            ileocecal valve, appendiceal orifice, and rectum                            were photographed. Scope In: 8:15:32 AM Scope Out: 8:40:10 AM Scope Withdrawal Time: 0 hours 20 minutes 19 seconds  Total Procedure Duration: 0 hours 24 minutes 38 seconds  Findings:                 The perianal and digital rectal examinations were                            normal.                           Three sessile polyps were found in the ascending                            colon and cecum. The polyps were 4 to 6 mm in size.                            These polyps were removed with a cold snare.  Resection and retrieval were complete.                           Four sessile polyps were found in the descending                            colon and transverse colon. The polyps were 4 to 6                            mm in size. These polyps were removed with a cold                            snare. Resection and retrieval were complete.                           Normal mucosa was found in the entire colon.                            Biopsies for histology were taken with a cold                            forceps from the right colon and left colon for                            evaluation of microscopic colitis.                           A tattoo was seen in the mid rectum. The tattoo                            site appeared normal.                           A 6 mm polyp was found in the rectum. The polyp was                            sessile. The polyp was removed  with a cold snare.                            Resection and retrieval were complete.                           Multiple diverticula were found in the left colon.                           Internal hemorrhoids were found.                           The exam was otherwise without abnormality on                            direct and retroflexion views. Complications:            No immediate complications. Estimated Blood  Loss:     Estimated blood loss was minimal. Impression:               - Three 4 to 6 mm polyps in the ascending colon and                            in the cecum, removed with a cold snare. Resected                            and retrieved.                           - Four 4 to 6 mm polyps in the descending colon and                            in the transverse colon, removed with a cold snare.                            Resected and retrieved.                           - Normal mucosa in the entire examined colon.                            Biopsied.                           - A tattoo was seen in the mid rectum. The tattoo                            site appeared normal.                           - One 6 mm polyp in the rectum, removed with a cold                            snare. Resected and retrieved.                           - Diverticulosis in the left colon.                           - Internal hemorrhoids.                           - The examination was otherwise normal on direct                            and retroflexion views. Recommendation:           - Patient has a contact number available for                            emergencies. The signs and symptoms of potential  delayed complications were discussed with the                            patient. Return to normal activities tomorrow.                            Written discharge instructions were provided to the                            patient.                           -  Resume previous diet.                           - Continue present medications.                           - Await pathology results.                           - Repeat colonoscopy is recommended for                            surveillance. The colonoscopy date will be                            determined after pathology results from today's                            exam become available for review.                           - See the other procedure note for documentation of                            additional recommendations. Kristianne Albin L. Loletha Carrow, MD 01/30/2021 8:58:26 AM This report has been signed electronically.

## 2021-01-30 NOTE — Progress Notes (Signed)
Cw vitals and JM IV.

## 2021-01-30 NOTE — Op Note (Signed)
[facility] Patient Name: Don Norman Procedure Date: 01/30/2021 7:29 AM MRN: [id number] Endoscopist: Mallie Mussel L. Loletha Carrow , MD Age: 75 Referring MD:  Date of Birth: [birth year] Gender: Male Account #: [id number] Procedure:                Upper GI endoscopy Indications:              Cirrhosis rule out esophageal varices, Nausea Medicines:                Monitored Anesthesia Care Procedure:                Pre-Anesthesia Assessment:                           - Prior to the procedure, a History and Physical                            was performed, and patient medications and                            allergies were reviewed. The patient's tolerance of                            previous anesthesia was also reviewed. The risks                            and benefits of the procedure and the sedation                            options and risks were discussed with the patient.                            All questions were answered, and informed consent                            was obtained. Prior Anticoagulants: The patient has                            taken no previous anticoagulant or antiplatelet                            agents. ASA Grade Assessment: III - A patient with                            severe systemic disease. After reviewing the risks                            and benefits, the patient was deemed in                            satisfactory condition to undergo the procedure.                           - Prior to the procedure, a History and Physical  was performed, and patient medications and                            allergies were reviewed. The patient's tolerance of                            previous anesthesia was also reviewed. The risks                            and benefits of the procedure and the sedation                            options and risks were discussed with the patient.                            All questions  were answered, and informed consent                            was obtained. Prior Anticoagulants: The patient has                            taken no previous anticoagulant or antiplatelet                            agents. ASA Grade Assessment: III - A patient with                            severe systemic disease. After reviewing the risks                            and benefits, the patient was deemed in                            satisfactory condition to undergo the procedure.                           After obtaining informed consent, the endoscope was                            passed under direct vision. Throughout the                            procedure, the patient's blood pressure, pulse, and                            oxygen saturations were monitored continuously. The                            Endoscope was introduced through the mouth, and                            advanced to the second part of duodenum. The upper  GI endoscopy was accomplished without difficulty.                            The patient tolerated the procedure fairly well. Scope In: Scope Out: Findings:                 The larynx was normal.                           Grade II varices were found in the lower third of                            the esophagus.                           Portal hypertensive gastropathy was found in the                            entire examined stomach.                           Diffuse inflammation characterized by adherent                            blood, congestion (edema) and erythema was found in                            the entire examined stomach. Several biopsies were                            obtained on the greater curvature of the gastric                            body, on the lesser curvature of the gastric body,                            on the greater curvature of the gastric antrum and                            on the lesser  curvature of the gastric antrum with                            cold forceps for histology.                           Type 2 gastroesophageal varices (GOV2, esophageal                            varices which extend along the fundus) with no                            bleeding were found in the cardia and in the                            gastric fundus.  Multiple erosions were found in the duodenal bulb.                           The exam of the duodenum was otherwise normal. Complications:            No immediate complications. Estimated Blood Loss:     Estimated blood loss was minimal. Impression:               - Normal larynx.                           - Grade II esophageal varices.                           - Portal hypertensive gastropathy.                           - Gastritis.                           - Type 2 gastroesophageal varices (GOV2, esophageal                            varices which extend along the fundus), without                            bleeding.                           - Duodenal erosions.                           - Several biopsies were obtained on the greater                            curvature of the gastric body, on the lesser                            curvature of the gastric body, on the greater                            curvature of the gastric antrum and on the lesser                            curvature of the gastric antrum. Recommendation:           - Patient has a contact number available for                            emergencies. The signs and symptoms of potential                            delayed complications were discussed with the                            patient. Return to normal activities tomorrow.  Written discharge instructions were provided to the                            patient.                           - Resume previous diet.                           - Discontinue  alcohol consumption.                           - Await pathology results.                           - See the other procedure note for documentation of                            additional recommendations.                           - [place] [facility] for management of                            non-selective beta blocker therapy (i.e.nadolol,                            propranolol, carvedilol) for primary variceal                            bleeding prophylaxis as well as for regular [place]                            screening. Rahmah Mccamy L. Loletha Carrow, MD 01/30/2021 9:11:26 AM This report has been signed electronically.

## 2021-01-30 NOTE — Progress Notes (Signed)
History and Physical:  This patient presents for endoscopic testing for: Encounter Diagnoses  Name Primary?   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     clotrimazole-betamethasone (LOTRISONE) cream Apply to affected area 2 times a day for one week only 15 g 0   gabapentin (NEURONTIN) 100 MG capsule TAKE ONE CAPSULE BY MOUTH THREE TIMES A DAY FOR 7 DAYS, THEN TAKE TWO CAPSULES THREE TIMES A DAY FOR 7 DAYS, THEN TAKE THREE CAPSULES THREE TIMES A DAY FOR NERVE PAIN     lidocaine (XYLOCAINE) 5 % ointment APPLY MODERATE AMOUNT TOPICALLY FOUR TIMES A DAY AS NEEDED APPLY TO SHOULDER FOR PAIN     Menthol-Methyl Salicylate (THERA-GESIC) 0.5-15 % CREA APPLY MODERATE AMOUNT TOPICALLY 3 TIMES A DAY AS NEEDED FOR PAIN ( AVOID FACE and EYES, [facility] HANDS AFTER USING).  APPLY TO SHOULDER  DO NOT APPLY DIRECTLY AFTER SHOWERING FOR PAIN ( AVOID FACE and EYES, [employer] HANDS AFTER USING).  APPLY TO SHOULDER  DO NOT APPLY DIRECTLY AFTER SHOWERING     omeprazole (PRILOSEC) 40 MG capsule TAKE ONE CAPSULE BY MOUTH TWO TIMES A DAY TO CONTROL STOMACH ACID.  TAKE 30 MINUTES BEFORE A MEAL      acetaminophen (TYLENOL) 325 MG tablet TAKE THREE TABLETS BY MOUTH EVERY 8 HOURS AS NEEDED FOR PAIN OR FEVER EACH TABLET CONTAINS 325MG  ACETAMINOPHEN (TYLENOL,APAP). MAXIMUM DAILY RECOMMENDED DOSE IS 3000MG      benzonatate (TESSALON) 200 MG capsule TAKE ONE CAPSULE BY MOUTH 3 TIMES A DAY AS NEEDED SWALLOW WHOLE DO NOT CUT FOR COUGH COUGH     cetirizine (ZYRTEC) 10 MG tablet Take 1 tablet by mouth daily.     diclofenac Sodium (VOLTAREN) 1 % GEL APPLY 2 GRAMS TOPICALLY FOUR TIMES A DAY     doxycycline (VIBRAMYCIN) 100 MG capsule Take 100 mg by mouth 2 (two) times daily. (Patient not taking: Reported on 01/30/2021)     fluticasone (FLONASE) 50 MCG/ACT nasal spray INSTILL 1 SPRAY INTO EACH NOSTRIL EVERY DAY MAXIMUM 2 SPRAYS IN EACH NOSTRIL DAILY. FOR ALLERGIES     guaiFENesin (MUCINEX) 600 MG 12 hr tablet Take 1 tablet by mouth every 12 (twelve) hours as needed.     HYDROcodone-acetaminophen (NORCO/VICODIN) 5-325 MG tablet Take 1 tablet by mouth every 6 (six) hours as needed.     ondansetron (ZOFRAN ODT) 4 MG disintegrating tablet Take 1 tablet (4 mg total) by mouth every 8 (eight) hours as needed for nausea or vomiting. 20 tablet 0   sucralfate (CARAFATE) 1  g tablet Take 1 tablet (1 g total) by mouth 4 (four) times daily -  with meals and at bedtime. 60 tablet 0   tamsulosin (FLOMAX) 0.4 MG CAPS capsule Take 1 capsule by mouth daily.     Current Facility-Administered Medications  Medication Dose Route Frequency Provider Last Rate Last Admin   0.9 %  sodium chloride infusion  500 mL Intravenous Once Nelida Meuse III, MD          ___________________________________________________________________ Objective   Exam:  BP (!) 144/98   Pulse 96   Temp (!) 97.2 F (36.2 C)   Ht 6' (1.829 m)   Wt 218 lb (98.9 kg)   SpO2 97%   BMI 29.57 kg/m   CV: RRR without murmur, S1/S2 Resp: clear to auscultation bilaterally, normal RR and effort noted GI: soft, no tenderness, with active bowel  sounds.   Assessment: Encounter Diagnoses  Name Primary?   Alcoholic cirrhosis of liver without ascites ([facility]) Yes   Personal history of colonic polyps    Nausea and vomiting in adult    Chronic diarrhea      Plan: Colonoscopy EGD   The patient is appropriate for an endoscopic procedure in the ambulatory setting.   - Wilfrid Lund, MD

## 2021-02-01 ENCOUNTER — Telehealth: Payer: Self-pay

## 2021-02-01 NOTE — Telephone Encounter (Signed)
  Follow up Call-  Call back number 01/30/2021  Post procedure Call Back phone  # [phone]  Permission to leave phone message Yes  Some recent data might be hidden     Patient questions:  Do you have a fever, pain , or abdominal swelling? No. Pain Score  0 *  Have you tolerated food without any problems? Yes.    Have you been able to return to your normal activities? Yes.    Do you have any questions about your discharge instructions: Diet   No. Medications  No. Follow up visit  No.  Do you have questions or concerns about your Care? No.  Actions: * If pain score is 4 or above: No action needed, pain <4.  Have you developed a fever since your procedure? no  2.   Have you had an respiratory symptoms (SOB or cough) since your procedure? no  3.   Have you tested positive for COVID 19 since your procedure no  4.   Have you had any family members/close contacts diagnosed with the COVID 19 since your procedure?  no   If yes to any of these questions please route to Joylene John, RN and Joella Prince, RN

## 2021-02-07 ENCOUNTER — Encounter: Payer: Self-pay | Admitting: Gastroenterology

## 2021-02-11 ENCOUNTER — Telehealth: Payer: Self-pay

## 2021-02-11 NOTE — Telephone Encounter (Signed)
Received a call from Dr. Kimberlee Nearing at [facility], he will be seeing patient in [facility] tomorrow. He wanted to discuss the results from patient's EGD/colonoscopy and pathology results. He states that he will call back tomorrow so that we can formally fax over the records. He states that patient was previously referred to Hepatology in October but he did not show up for that appt.  He states that patient would like to receive community care at our office but they are not sure if he will be eligible. Dr. Kimberlee Nearing states that he will call us and follow up tomorrow.

## 2021-02-20 NOTE — Telephone Encounter (Signed)
Called to follow up with patient's wife after patient's visit to the [place]. Pt's wife reports that she does not think that the [place] will cover him coming back to our office. Pt's wife states that she is going to ask them again because they would prefer to be seen here. [facility] Pt's wife reports that patient has an appt with them, she could not remember the exact date because she is not home at this time. Records are in care everywhere.

## 2021-02-20 NOTE — Telephone Encounter (Signed)
Thank you for the update.  If this patient is approved for community care with [place], I will be glad to see him regarding his liver disease.  - HD

## 2021-02-21 NOTE — Telephone Encounter (Signed)
Noted, thanks!

## 2021-03-07 ENCOUNTER — Encounter (HOSPITAL_COMMUNITY): Payer: Self-pay | Admitting: Emergency Medicine

## 2021-03-07 ENCOUNTER — Emergency Department (HOSPITAL_COMMUNITY)
Admission: EM | Admit: 2021-03-07 | Discharge: 2021-03-07 | Disposition: A | Discharge status: Home / Self Care | Payer: No Typology Code available for payment source | Attending: Emergency Medicine | Admitting: Emergency Medicine

## 2021-03-07 ENCOUNTER — Emergency Department (HOSPITAL_COMMUNITY): Payer: No Typology Code available for payment source

## 2021-03-07 ENCOUNTER — Other Ambulatory Visit: Payer: Self-pay

## 2021-03-07 DIAGNOSIS — Z87891 Personal history of nicotine dependence: Secondary | ICD-10-CM | POA: Insufficient documentation

## 2021-03-07 DIAGNOSIS — I10 Essential (primary) hypertension: Secondary | ICD-10-CM | POA: Diagnosis not present

## 2021-03-07 DIAGNOSIS — R21 Rash and other nonspecific skin eruption: Secondary | ICD-10-CM | POA: Diagnosis present

## 2021-03-07 DIAGNOSIS — Z7982 Long term (current) use of aspirin: Secondary | ICD-10-CM | POA: Insufficient documentation

## 2021-03-07 DIAGNOSIS — B372 Candidiasis of skin and nail: Secondary | ICD-10-CM | POA: Diagnosis not present

## 2021-03-07 DIAGNOSIS — Z7951 Long term (current) use of inhaled steroids: Secondary | ICD-10-CM | POA: Diagnosis not present

## 2021-03-07 LAB — CBC
HCT: 48.4 % (ref 39.0–52.0)
Hemoglobin: 16.8 g/dL (ref 13.0–17.0)
MCH: 35.1 pg — ABNORMAL HIGH (ref 26.0–34.0)
MCHC: 34.7 g/dL (ref 30.0–36.0)
MCV: 101.3 fL — ABNORMAL HIGH (ref 80.0–100.0)
Platelets: 119 10*3/uL — ABNORMAL LOW (ref 150–400)
RBC: 4.78 MIL/uL (ref 4.22–5.81)
RDW: 13.2 % (ref 11.5–15.5)
WBC: 6.5 10*3/uL (ref 4.0–10.5)
nRBC: 0 % (ref 0.0–0.2)

## 2021-03-07 LAB — BASIC METABOLIC PANEL
Anion gap: 12 (ref 5–15)
BUN: 10 mg/dL (ref 8–23)
CO2: 23 mmol/L (ref 22–32)
Calcium: 8.9 mg/dL (ref 8.9–10.3)
Chloride: 98 mmol/L (ref 98–111)
Creatinine, Ser: 0.81 mg/dL (ref 0.61–1.24)
GFR, Estimated: >60 mL/min (ref >60)
Glucose, Bld: 140 mg/dL — ABNORMAL HIGH (ref 70–99)
Potassium: 3.9 mmol/L (ref 3.5–5.1)
Sodium: 133 mmol/L — ABNORMAL LOW (ref 135–145)

## 2021-03-07 LAB — TROPONIN I (HIGH SENSITIVITY)
Troponin I (High Sensitivity): 16 ng/L (ref <18)
Troponin I (High Sensitivity): 15 ng/L (ref <18)

## 2021-03-07 IMAGING — CR DG CHEST 2V
2 series · 2 of 2 positions shown · non-contrast
Comparison: None.

CLINICAL DATA: Chest pain

EXAM:
CHEST - 2 VIEW

[w chest lat]
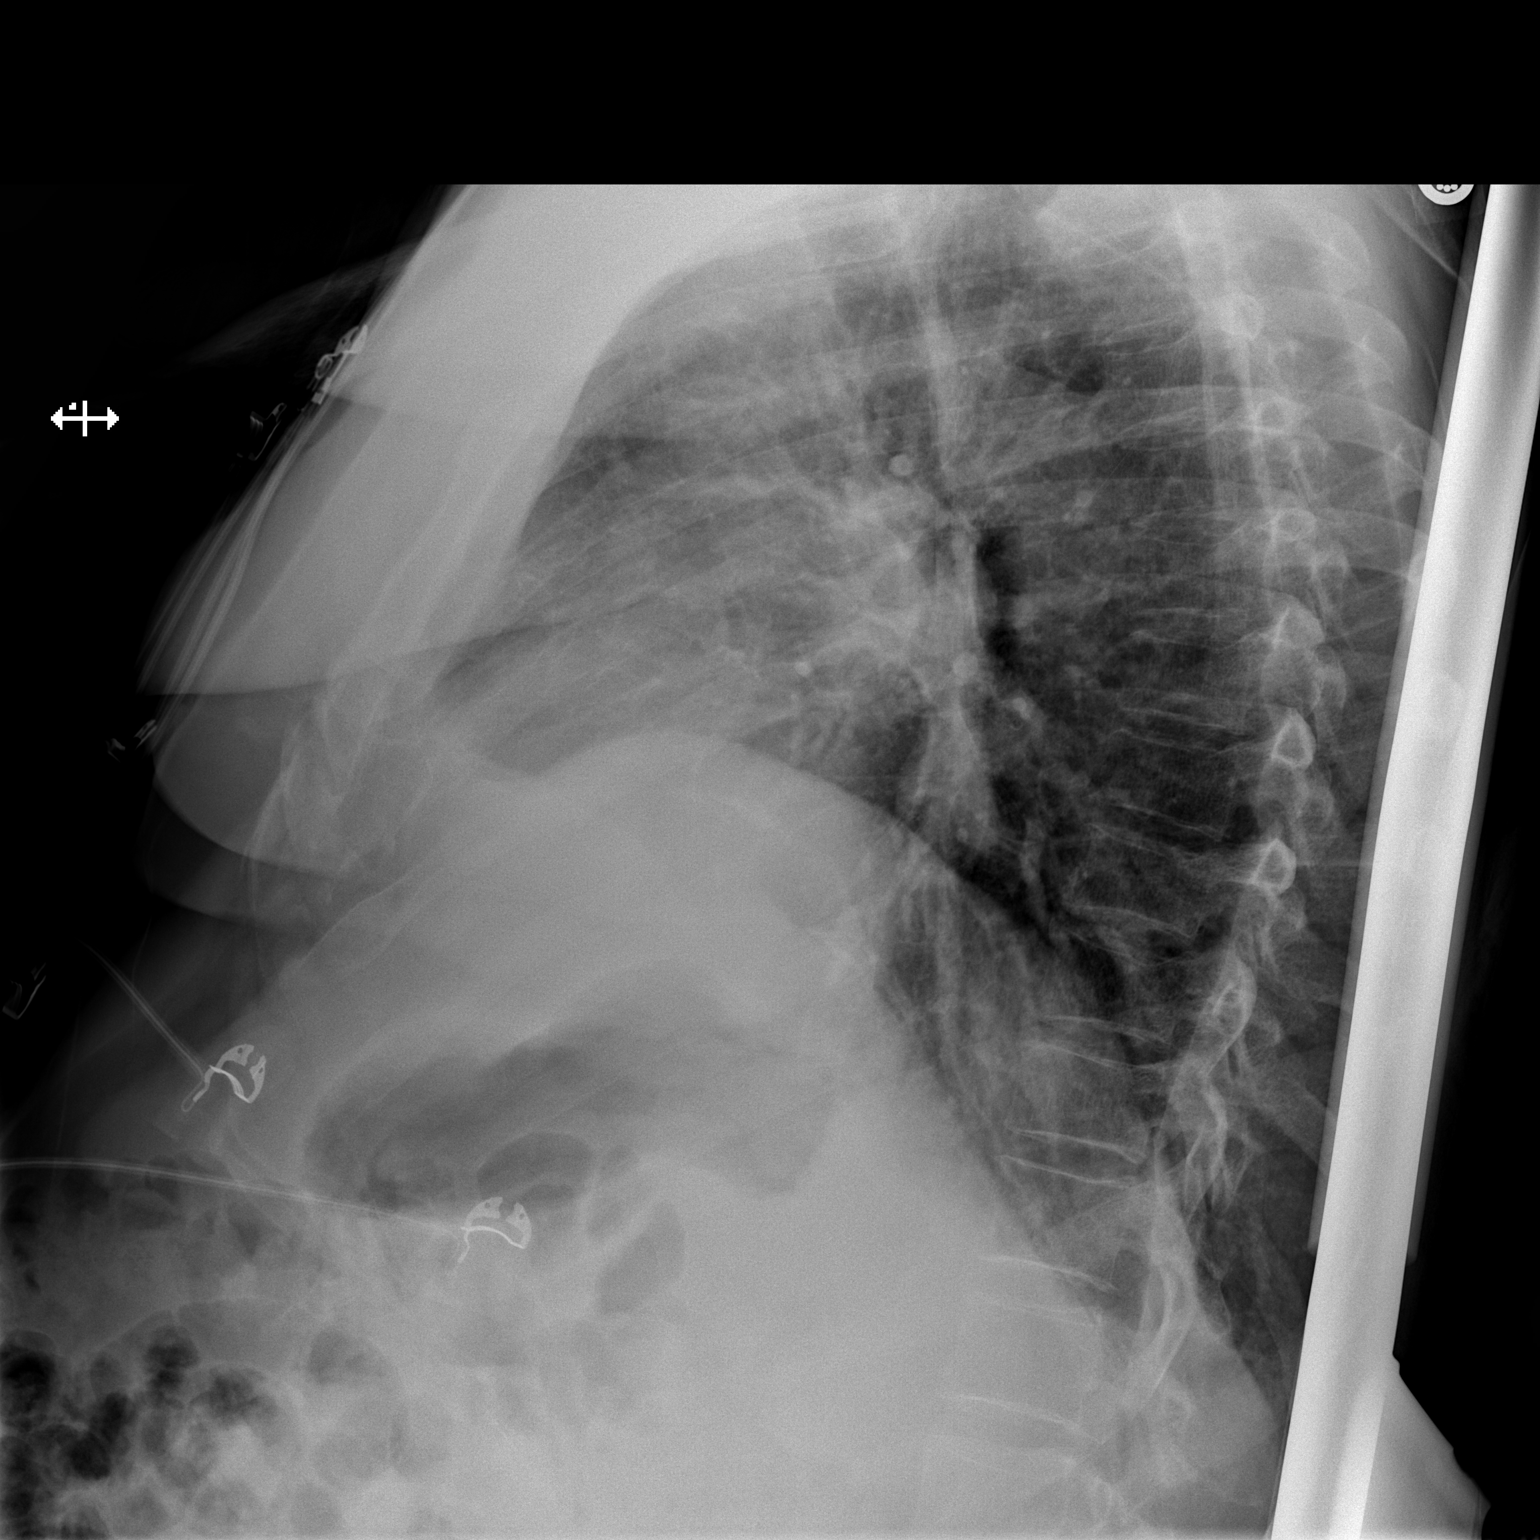

[x chest ap]
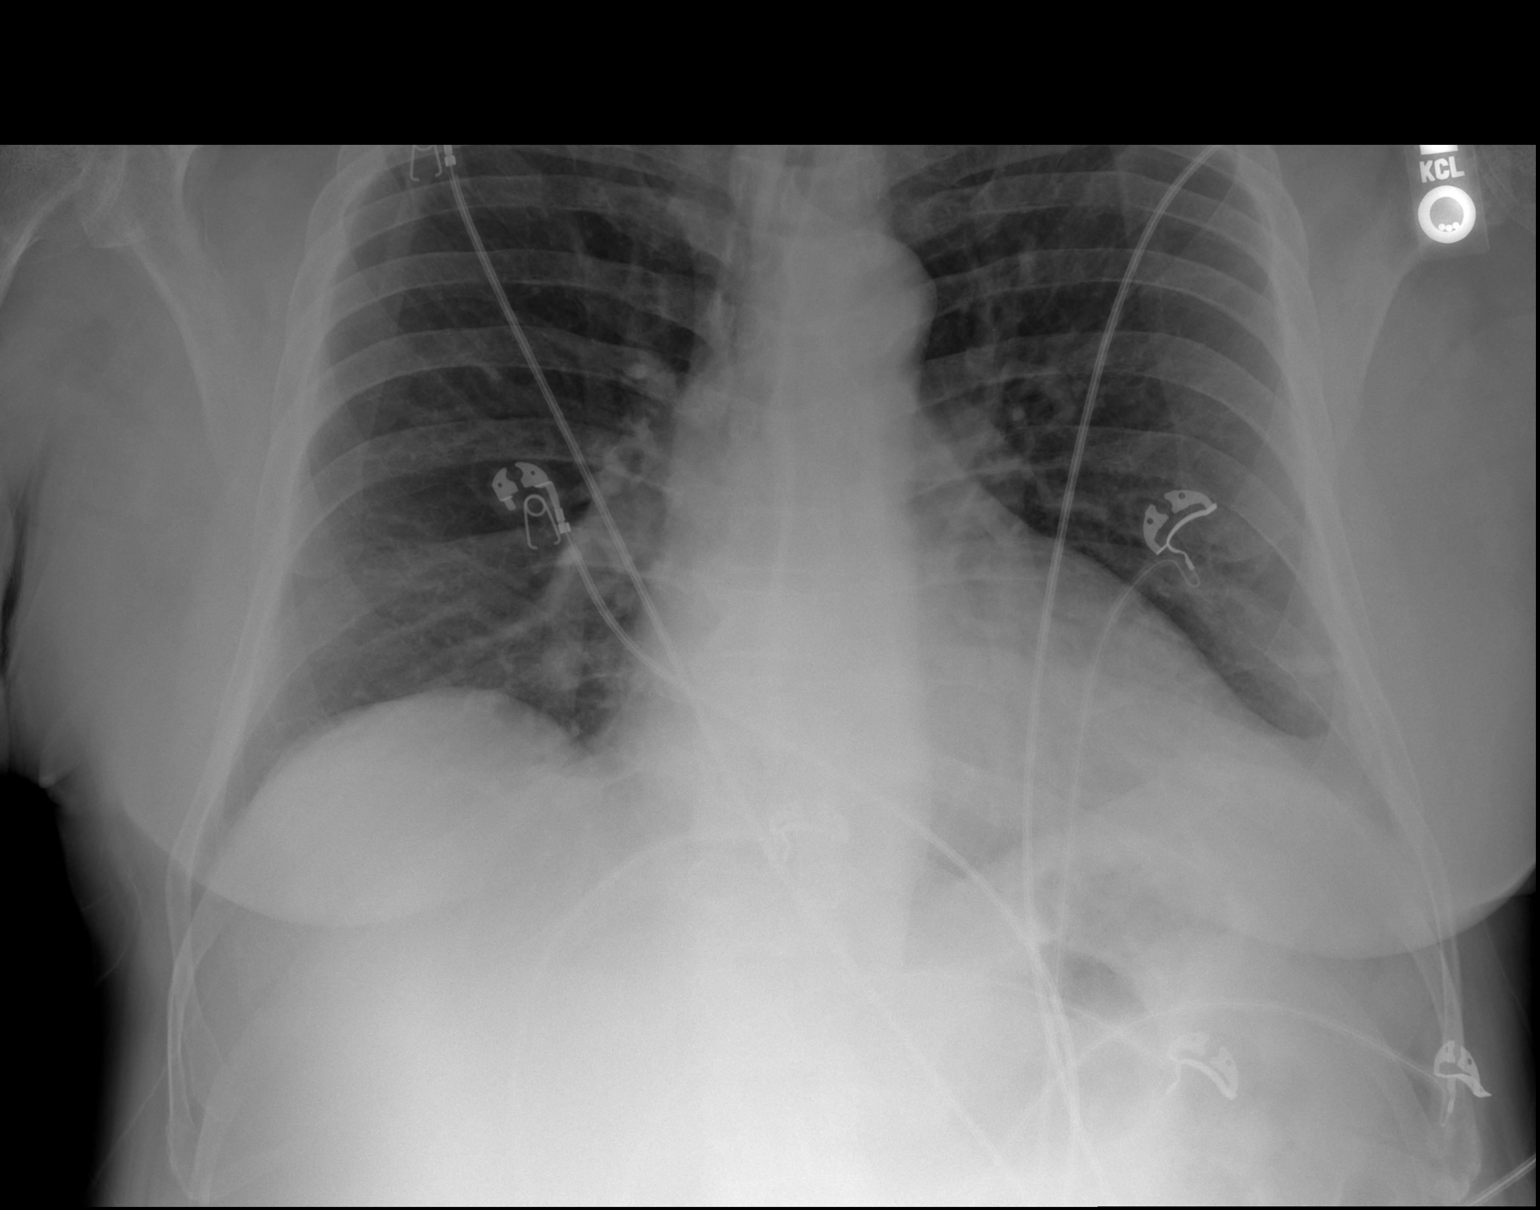

[2 of 2 positions shown; findings below may reference images not displayed]

FINDINGS: Top-normal heart size. Both lungs are clear. No pleural effusion or
pneumothorax. The visualized skeletal structures are unremarkable.
IMPRESSION: No acute process in the chest.

## 2021-03-07 MED ORDER — CYCLOBENZAPRINE HCL 10 MG PO TABS: 10.0000 mg | ORAL_TABLET | Freq: Two times a day (BID) | ORAL | 0 refills | Status: DC | PRN

## 2021-03-07 MED ORDER — NYSTATIN 100000 UNIT/GM EX CREA: 1.0000 "application " | TOPICAL_CREAM | Freq: Four times a day (QID) | CUTANEOUS | 0 refills | Status: DC

## 2021-03-07 MED ORDER — FLUCONAZOLE 150 MG PO TABS: 150.0000 mg | ORAL_TABLET | Freq: Every day | ORAL | 0 refills | Status: DC

## 2021-03-07 NOTE — ED Triage Notes (Signed)
Complains of neck, L leg and R chest cramping and pain x4-5 days, has topical cream that helps a little with the cream. L underarm area is raw and open, has been this way for a while but it is worse now.

## 2021-03-07 NOTE — ED Provider Notes (Signed)
[facility] DEPT Provider Note   CSN: [id number] Arrival date & time: 03/07/21  1044     History Chief Complaint  Patient presents with   Chest Pain   Leg Pain   Rash    Don Norman is a 75 y.o. male.  The history is provided by the patient and medical records. No language interpreter was used.  Chest Pain Leg Pain Rash  75 year old male significant history of anxiety, alcohol abuse, hypertension, GERD, presenting with multiple complaints.  Patient report for the past month he has had a rash to his left armpit that is very painful.  He describes it as a burning sensation oozing out fluid.  He tries using a cream that was previously given to him in from the [place].  He mention it is a 1% cream but unsure what type of cream it is.  States that it did not provide any relief to his rash.  Furthermore, he also complaining of intermittent cramping to his arms and his chest and his thigh.  This is a recurrent issue for the past month as well.  He denies fever, productive cough, shortness of breath, exertional chest pain.   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   Past Surgical History:  Procedure Laterality Date   COLONOSCOPY  2017       Family History  Problem Relation Age of Onset   Cancer Maternal Uncle        x 4; unknown type   Colon cancer Neg Hx    Esophageal cancer Neg Hx    Pancreatic cancer Neg Hx    Stomach cancer Neg Hx    Liver disease Neg Hx     Social History   Tobacco Use   Smoking status: Former   Smokeless tobacco: Never  Scientific laboratory technician Use: Never used  Substance Use Topics   Alcohol use:  Yes    Comment: drinks 1/5 in 2 days   Drug use: No    Home Medications Prior to Admission medications   Medication Sig Start Date End Date Taking? Authorizing Provider  acetaminophen (TYLENOL) 325 MG tablet TAKE THREE TABLETS BY MOUTH EVERY 8 HOURS AS NEEDED FOR PAIN OR FEVER EACH TABLET CONTAINS 325MG  ACETAMINOPHEN (TYLENOL,APAP). MAXIMUM DAILY RECOMMENDED DOSE IS 3000MG  06/26/20   [provider]  aspirin EC 81 MG tablet Take 81 mg by mouth daily.    [provider]  benzonatate (TESSALON) 200 MG capsule TAKE ONE CAPSULE BY MOUTH 3 TIMES A DAY AS NEEDED SWALLOW WHOLE DO NOT CUT FOR COUGH COUGH 08/23/20   [provider]  cetirizine (ZYRTEC) 10 MG tablet Take 1 tablet by mouth daily. 08/15/20   [provider]  clotrimazole-betamethasone (LOTRISONE) cream Apply to affected area 2 times a day for one week only 07/23/20   Suella Broad A, PA-C  diclofenac Sodium (VOLTAREN) 1 % GEL APPLY 2 GRAMS TOPICALLY FOUR TIMES A DAY 06/26/20   [provider]  doxycycline (VIBRAMYCIN) 100 MG capsule Take 100 mg by mouth 2 (two) times daily. Patient  not taking: Reported on 01/30/2021 07/23/20   [provider]  fluticasone (FLONASE) 50 MCG/ACT nasal spray INSTILL 1 SPRAY INTO EACH NOSTRIL EVERY DAY MAXIMUM 2 SPRAYS IN EACH NOSTRIL DAILY. FOR ALLERGIES 08/23/20   [provider]  gabapentin (NEURONTIN) 100 MG capsule TAKE ONE CAPSULE BY MOUTH THREE TIMES A DAY FOR 7 DAYS, THEN TAKE TWO CAPSULES THREE TIMES A DAY FOR 7 DAYS, THEN TAKE THREE CAPSULES THREE TIMES A DAY FOR NERVE PAIN 08/15/20   [provider]  guaiFENesin (MUCINEX) 600 MG 12 hr tablet Take 1 tablet by mouth every 12 (twelve) hours as needed. 08/23/20   [provider]  HYDROcodone-acetaminophen (NORCO/VICODIN) 5-325 MG tablet Take 1 tablet by mouth every 6 (six) hours as needed. 07/23/20   [provider]  lidocaine (XYLOCAINE) 5 % ointment APPLY MODERATE AMOUNT TOPICALLY  FOUR TIMES A DAY AS NEEDED APPLY TO SHOULDER FOR PAIN 08/23/20   [provider]  Menthol-Methyl Salicylate (THERA-GESIC) 0.5-15 % CREA APPLY MODERATE AMOUNT TOPICALLY 3 TIMES A DAY AS NEEDED FOR PAIN ( AVOID FACE and EYES, [place] HANDS AFTER USING).  APPLY TO SHOULDER  DO NOT APPLY DIRECTLY AFTER SHOWERING FOR PAIN ( AVOID FACE and EYES, [place] HANDS AFTER USING).  APPLY TO SHOULDER  DO NOT APPLY DIRECTLY AFTER SHOWERING 09/27/20   [provider]  omeprazole (PRILOSEC) 40 MG capsule TAKE ONE CAPSULE BY MOUTH TWO TIMES A DAY TO CONTROL STOMACH ACID.  TAKE 30 MINUTES BEFORE A MEAL 06/26/20   [provider]  ondansetron (ZOFRAN ODT) 4 MG disintegrating tablet Take 1 tablet (4 mg total) by mouth every 8 (eight) hours as needed for nausea or vomiting. 08/14/16   Gareth Morgan, MD  sucralfate (CARAFATE) 1 g tablet Take 1 tablet (1 g total) by mouth 4 (four) times daily -  with meals and at bedtime. 08/14/16   Carlisle Cater, PA-C  tamsulosin (FLOMAX) 0.4 MG CAPS capsule Take 1 capsule by mouth daily. 08/23/20   [provider]    Allergies    Lisinopril  Review of Systems   Review of Systems  Cardiovascular:  Positive for chest pain.  Skin:  Positive for rash.  All other systems reviewed and are negative.  Physical Exam Updated Vital Signs BP (!) 182/99    Pulse 93    Temp 98.2 F (36.8 C) (Oral)    Resp 18    Ht 6' (1.829 m)    Wt 97.5 kg    SpO2 99%    BMI 29.16 kg/m   Physical Exam Vitals and nursing note reviewed.  Constitutional:      General: He is not in acute distress.    Appearance: He is well-developed.  HENT:     Head: Atraumatic.  Eyes:     Conjunctiva/sclera: Conjunctivae normal.  Cardiovascular:     Rate and Rhythm: Normal rate and regular rhythm.     Pulses: Normal pulses.     Heart sounds: Normal heart sounds.  Pulmonary:     Effort: Pulmonary effort is normal.     Breath sounds: Normal breath sounds.  Chest:     Chest wall: No tenderness.   Abdominal:     Tenderness: There is no abdominal tenderness.  Musculoskeletal:     Cervical back: Neck supple.  Skin:    Comments: Left axillary fold: Moderate amount of macerated skin changes within the axillary fold oozing out some serous fluid and tenderness to palpation but no induration or fluctuance appreciated. Right axillary  fold with normal appearance.  Neurological:     Mental Status: He is alert. Mental status is at baseline.  Psychiatric:        Mood and Affect: Mood normal.    ED Results / Procedures / Treatments   Labs (all labs ordered are listed, but only abnormal results are displayed) Labs Reviewed  BASIC METABOLIC PANEL - Abnormal; Notable for the following components:      Result Value   Sodium 133 (*)    Glucose, Bld 140 (*)    All other components within normal limits  CBC - Abnormal; Notable for the following components:   MCV 101.3 (*)    MCH 35.1 (*)    Platelets 119 (*)    All other components within normal limits  TROPONIN I (HIGH SENSITIVITY)  TROPONIN I (HIGH SENSITIVITY)    EKG None ED ECG REPORT   Date: 03/07/2021  Rate: 94  Rhythm: normal sinus rhythm  QRS Axis: left  Intervals: QT prolonged  ST/T Wave abnormalities: nonspecific ST changes  Conduction Disutrbances:left bundle branch block  Narrative Interpretation:   Old EKG Reviewed: unchanged  I have personally reviewed the EKG tracing and agree with the computerized printout as noted.   Radiology DG Chest 2 View  Result Date: 03/07/2021 CLINICAL DATA:  Chest pain EXAM: CHEST - 2 VIEW COMPARISON:  None. FINDINGS: Top-normal heart size. Both lungs are clear. No pleural effusion or pneumothorax. The visualized skeletal structures are unremarkable. IMPRESSION: No acute process in the chest. Electronically Signed   By: Macy Mis M.D.   On: 03/07/2021 11:53    Procedures Procedures   Medications Ordered in ED Medications - No data to display  ED Course  I have reviewed  the triage vital signs and the nursing notes.  Pertinent labs & imaging results that were available during my care of the patient were reviewed by me and considered in my medical decision making (see chart for details).    MDM Rules/Calculators/A&P                         BP (!) 182/99    Pulse 93    Temp 98.2 F (36.8 C) (Oral)    Resp 18    Ht 6' (1.829 m)    Wt 97.5 kg    SpO2 99%    BMI 29.16 kg/m  The patient was noted to be hypertensive today in the emergency department. I have spoken with the patient regarding hypertension and the need for improved management. I instructed the patient to followup with the Primary care doctor within 4 days to improve the management of the patients hypertension. I also counseled the patient regarding the signs and symptoms which would require an emergent visit to an emergency department for hypertensive urgency and/or hypertensive emergency. The patient understood the need for improved hypertensive management.      Final Clinical Impression(s) / ED Diagnoses Final diagnoses:  Candidal skin infection    Rx / DC Orders ED Discharge Orders          Ordered    nystatin cream (MYCOSTATIN)  4 times daily        03/07/21 1802    fluconazole (DIFLUCAN) 150 MG tablet  Daily        03/07/21 1802    cyclobenzaprine (FLEXERIL) 10 MG tablet  2 times daily PRN        03/07/21 1803  5:14 PM Patient complaining of a burning rash to his left axillary region for the past month seemly not improved despite using an ointment in which he described has a 1% mark on it.  I suspect this is likely a steroid cream.  His rash is consistent with candidal infection.  No obvious abscess appreciated.  Steroid cream likely worsen his rash.  I recommended avoid this cream, and will place patient on nystatin powder and will give him a dose of Diflucan.  His symptom is not consistent with ACS.  He also complaining of muscle cramps.  Labs obtained and are  unremarkable, no significant electrolyte derangement.EKG shows an old left bundle branch block without any concerning ischemic changes, negative troponin, chest x-ray unremarkable  Care discussed with Dr. Ashok Cordia.         Domenic Moras, PA-C 03/07/21 1804    Lajean Saver, MD 03/07/21 [phone]

## 2021-03-07 NOTE — ED Provider Notes (Signed)
Emergency Medicine Provider Triage Evaluation Note  Don Norman , a 75 y.o. male  was evaluated in triage.  Pt complains of right sided chest pain and left upper leg pain for the past week. He reports a worsening rash under his left axilla worsening for the past few weeks. Denies any fevers, nausea, SOB, or weakness  Review of Systems  Positive: Chest pain, leg pain, rash Negative: fevers, nausea, SOB, or weakness  Physical Exam  BP (!) 148/91 (BP Location: Left Arm)    Pulse 94    Temp 98.2 F (36.8 C) (Oral)    Resp (!) 24    Ht 6' (1.829 m)    Wt 97.5 kg    SpO2 96%    BMI 29.16 kg/m  Gen:   Awake, no distress   Resp:  Normal effort  MSK:   Moves extremities without difficulty Other:  Oozing rash under the left axilla. Pt's shirt and pain limited my view.   Medical Decision Making  Medically screening exam initiated at 11:20 AM.  Appropriate orders placed.  [name] was informed that the remainder of the evaluation will be completed by another provider, this initial triage assessment does not replace that evaluation, and the importance of remaining in the ED until their evaluation is complete.  Chest pain order set placed   Sherrell Puller, PA-C 03/07/21 1122    Valarie Merino, MD 03/08/21 [phone]

## 2021-03-07 NOTE — Discharge Instructions (Addendum)
The rash in your left armpit is likely due to a yeast infection.  Please apply nystatin powder/cream several times daily for the next 7 to 10 days.  You may take Diflucan pill once.  Take Flexeril as needed for aches and cramps.  Follow-up with your doctor for further care.  Your blood pressure is elevated today, please have it rechecked by your primary care doctor at your earliest convenience.

## 2021-08-24 ENCOUNTER — Encounter (HOSPITAL_COMMUNITY): Payer: Self-pay

## 2021-08-24 ENCOUNTER — Emergency Department (HOSPITAL_COMMUNITY): Payer: No Typology Code available for payment source

## 2021-08-24 ENCOUNTER — Emergency Department (HOSPITAL_COMMUNITY)
Admission: EM | Admit: 2021-08-24 | Discharge: 2021-08-24 | Disposition: A | Discharge status: Home / Self Care | Payer: No Typology Code available for payment source | Attending: Emergency Medicine | Admitting: Emergency Medicine

## 2021-08-24 ENCOUNTER — Emergency Department (HOSPITAL_BASED_OUTPATIENT_CLINIC_OR_DEPARTMENT_OTHER)
Admit: 2021-08-24 | Discharge: 2021-08-24 | Disposition: A | Discharge status: Home / Self Care | Payer: No Typology Code available for payment source | Attending: Emergency Medicine | Admitting: Emergency Medicine

## 2021-08-24 DIAGNOSIS — R609 Edema, unspecified: Secondary | ICD-10-CM

## 2021-08-24 DIAGNOSIS — M7989 Other specified soft tissue disorders: Secondary | ICD-10-CM | POA: Diagnosis present

## 2021-08-24 DIAGNOSIS — R600 Localized edema: Secondary | ICD-10-CM | POA: Diagnosis not present

## 2021-08-24 LAB — COMPREHENSIVE METABOLIC PANEL
ALT: 23 U/L (ref 0–44)
AST: 46 U/L — ABNORMAL HIGH (ref 15–41)
Albumin: 3.1 g/dL — ABNORMAL LOW (ref 3.5–5.0)
Alkaline Phosphatase: 87 U/L (ref 38–126)
Anion gap: 9 (ref 5–15)
BUN: 6 mg/dL — ABNORMAL LOW (ref 8–23)
CO2: 21 mmol/L — ABNORMAL LOW (ref 22–32)
Calcium: 8.4 mg/dL — ABNORMAL LOW (ref 8.9–10.3)
Chloride: 101 mmol/L (ref 98–111)
Creatinine, Ser: 0.61 mg/dL (ref 0.61–1.24)
GFR, Estimated: >60 mL/min (ref >60)
Glucose, Bld: 125 mg/dL — ABNORMAL HIGH (ref 70–99)
Potassium: 3.8 mmol/L (ref 3.5–5.1)
Sodium: 131 mmol/L — ABNORMAL LOW (ref 135–145)
Total Bilirubin: 2.3 mg/dL — ABNORMAL HIGH (ref 0.3–1.2)
Total Protein: 7.5 g/dL (ref 6.5–8.1)

## 2021-08-24 LAB — CBC WITH DIFFERENTIAL/PLATELET
Abs Immature Granulocytes: 0.01 10*3/uL (ref 0.00–0.07)
Basophils Absolute: 0 10*3/uL (ref 0.0–0.1)
Basophils Relative: 0 %
Eosinophils Absolute: 0 10*3/uL (ref 0.0–0.5)
Eosinophils Relative: 1 %
HCT: 46.4 % (ref 39.0–52.0)
Hemoglobin: 16 g/dL (ref 13.0–17.0)
Immature Granulocytes: 0 %
Lymphocytes Relative: 27 %
Lymphs Abs: 1.3 10*3/uL (ref 0.7–4.0)
MCH: 35.4 pg — ABNORMAL HIGH (ref 26.0–34.0)
MCHC: 34.5 g/dL (ref 30.0–36.0)
MCV: 102.7 fL — ABNORMAL HIGH (ref 80.0–100.0)
Monocytes Absolute: 0.4 10*3/uL (ref 0.1–1.0)
Monocytes Relative: 8 %
Neutro Abs: 3 10*3/uL (ref 1.7–7.7)
Neutrophils Relative %: 64 %
Platelets: 96 10*3/uL — ABNORMAL LOW (ref 150–400)
RBC: 4.52 MIL/uL (ref 4.22–5.81)
RDW: 12.8 % (ref 11.5–15.5)
WBC: 4.8 10*3/uL (ref 4.0–10.5)
nRBC: 0 % (ref 0.0–0.2)

## 2021-08-24 IMAGING — CR DG ABDOMEN ACUTE W/ 1V CHEST
4 series · 4 of 4 positions shown · non-contrast
Comparison: Chest radiograph on [DATE]

CLINICAL DATA: Shortness of breath for 2 months.

EXAM:
DG ABDOMEN ACUTE WITH 1 VIEW CHEST

[w chest pa]
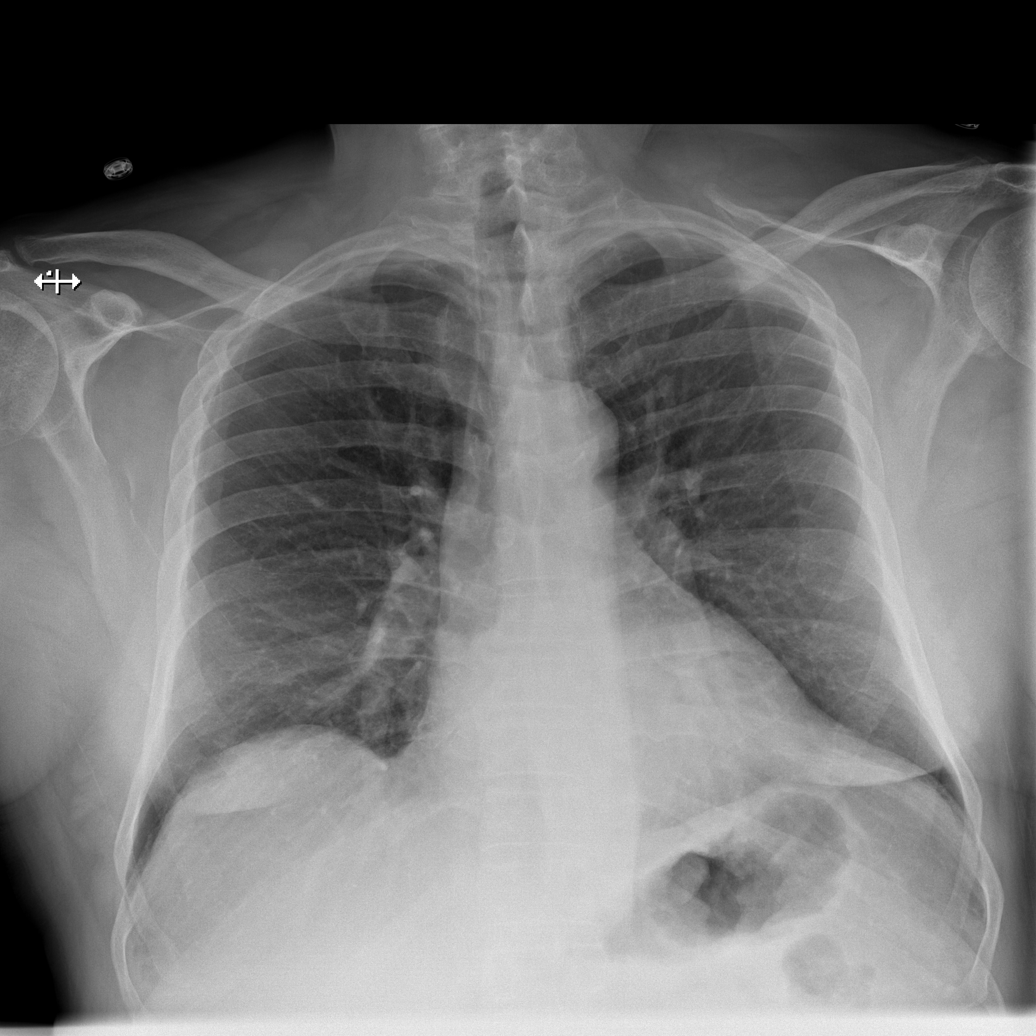

[w abdomen upright]
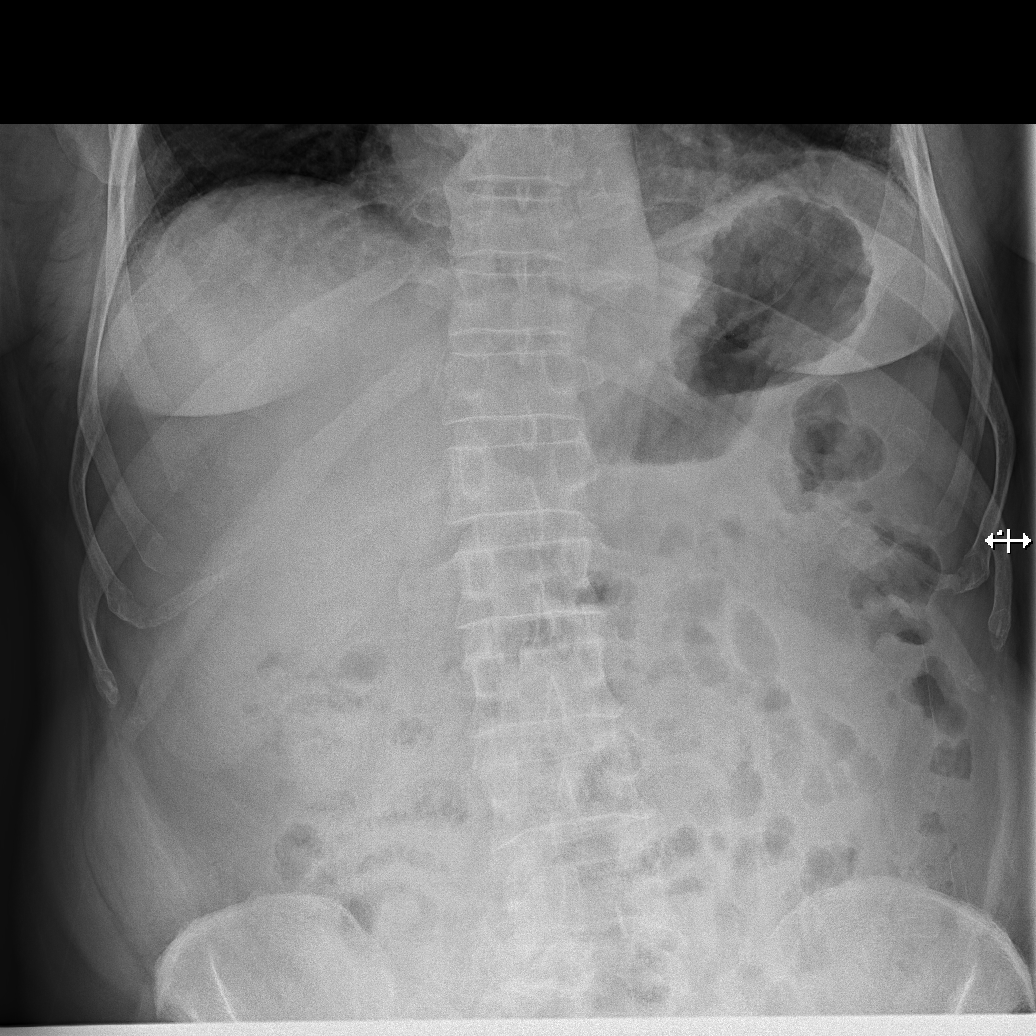

[x abdomen supine (1 of 2)]
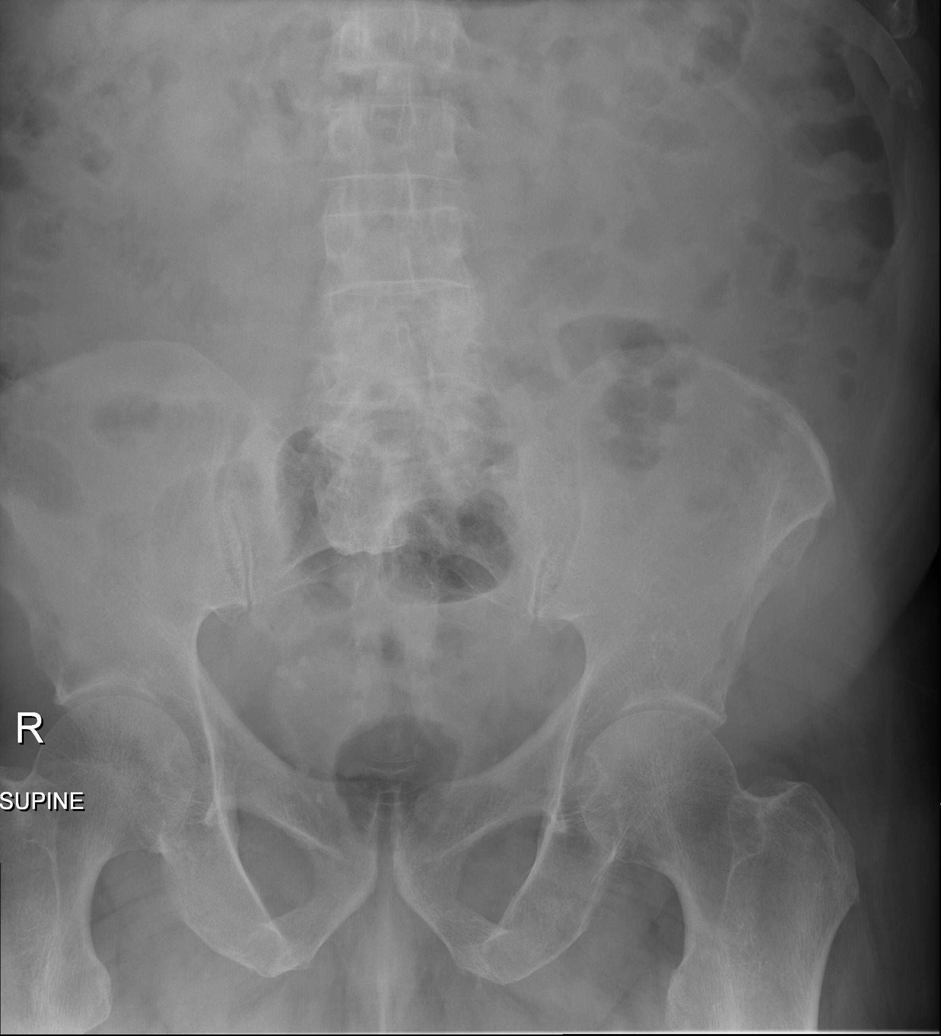

[x abdomen supine (2 of 2)]
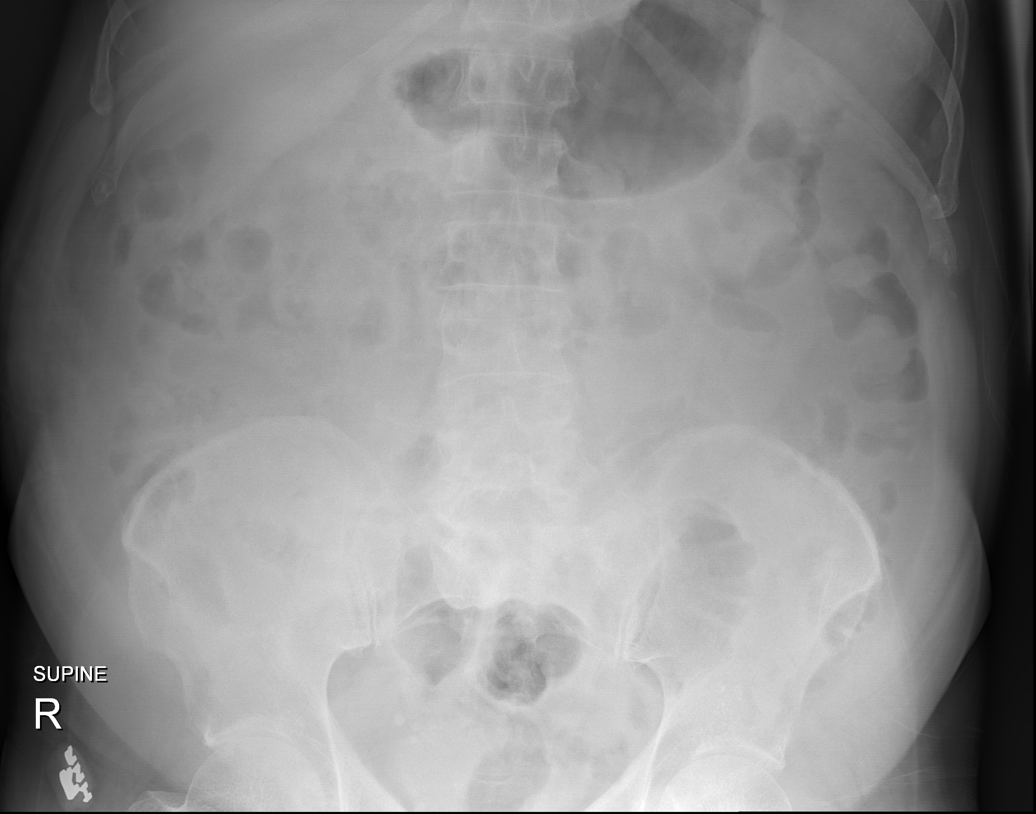

[4 of 4 positions shown; findings below may reference images not displayed]

FINDINGS: There is no evidence of dilated bowel loops or free intraperitoneal
air. No radiopaque calculi or other significant radiographic
abnormality is seen.

Heart size and mediastinal contours are within normal limits. Both
lungs are clear.
IMPRESSION: Normal bowel gas pattern.

No active cardiopulmonary disease.

## 2021-08-24 MED ORDER — FUROSEMIDE 20 MG PO TABS: 20.0000 mg | ORAL_TABLET | Freq: Every day | ORAL | 0 refills | Status: AC

## 2021-08-24 NOTE — ED Notes (Signed)
Pt ambulated to bathroom without assistance 

## 2021-08-24 NOTE — ED Notes (Signed)
Pt d/c home with wife per MD order. Discharge summary reviewed, pt verbalizes understanding. Ambulatory off unit. No s/s of acute distress noted at discharge.

## 2021-08-24 NOTE — Discharge Instructions (Signed)
Follow up with your md in 1-2 weeks. °

## 2021-08-24 NOTE — ED Triage Notes (Signed)
Pt presents with c/o right foot swelling. Pt reports the foot and leg have been swelling for several weeks but has been worse the last few days. Pt also reports that his right hand had been "shaking" for approx 3 weeks.

## 2021-08-24 NOTE — Progress Notes (Signed)
Right lower extremity venous duplex completed. Refer to "CV Proc" under chart review to view preliminary results.  08/24/2021 12:08 PM Kelby Aline., MHA, RVT, RDCS, RDMS

## 2021-08-24 NOTE — ED Provider Notes (Signed)
[facility] DEPT Provider Note   CSN: [id number] Arrival date & time: 08/24/21  2119     History  Chief Complaint  Patient presents with   Foot Swelling    Don Norman is a 76 y.o. male.  Patient with a history of EtOH abuse.  Patient complains of swelling to his right lower leg.  The history is provided by the patient and medical records. No language interpreter was used.  Leg Pain Lower extremity pain location: Right lower leg swelling. Injury: no   Pain details:    Quality:  Aching   Radiates to:  Does not radiate   Onset quality:  Sudden   Timing:  Constant   Progression:  Worsening Chronicity:  New Dislocation: no   Associated symptoms: no back pain and no fatigue       Home Medications Prior to Admission medications   Medication Sig Start Date End Date Taking? Authorizing Provider  furosemide (LASIX) 20 MG tablet Take 1 tablet (20 mg total) by mouth daily. 08/24/21  Yes Milton Ferguson, MD      Allergies    Zestril [lisinopril]    Review of Systems   Review of Systems  Constitutional:  Negative for appetite change and fatigue.  HENT:  Negative for congestion, ear discharge and sinus pressure.   Eyes:  Negative for discharge.  Respiratory:  Negative for cough.   Cardiovascular:  Negative for chest pain.  Gastrointestinal:  Negative for abdominal pain and diarrhea.  Genitourinary:  Negative for frequency and hematuria.  Musculoskeletal:  Negative for back pain.       Swelling right leg  Skin:  Negative for rash.  Neurological:  Negative for seizures and headaches.  Psychiatric/Behavioral:  Negative for hallucinations.    Physical Exam Updated Vital Signs BP (!) 150/90   Pulse 70   Temp 98.1 F (36.7 C) (Oral)   Resp 19   SpO2 97%  Physical Exam Vitals and nursing note reviewed.  Constitutional:      Appearance: He is well-developed.  HENT:     Head: Normocephalic.     Nose: Nose normal.  Eyes:      General: No scleral icterus.    Conjunctiva/sclera: Conjunctivae normal.  Neck:     Thyroid: No thyromegaly.  Cardiovascular:     Rate and Rhythm: Normal rate and regular rhythm.     Heart sounds: No murmur heard.   No friction rub. No gallop.  Pulmonary:     Breath sounds: No stridor. No wheezing or rales.  Chest:     Chest wall: No tenderness.  Abdominal:     General: There is no distension.     Tenderness: There is no abdominal tenderness. There is no rebound.  Musculoskeletal:     Cervical back: Neck supple.     Comments: Minimal tenderness and swelling to right leg  Lymphadenopathy:     Cervical: No cervical adenopathy.  Skin:    Findings: No erythema or rash.  Neurological:     Mental Status: He is alert and oriented to person, place, and time.     Motor: No abnormal muscle tone.     Coordination: Coordination normal.  Psychiatric:        Behavior: Behavior normal.    ED Results / Procedures / Treatments   Labs (all labs ordered are listed, but only abnormal results are displayed) Labs Reviewed  CBC WITH DIFFERENTIAL/PLATELET - Abnormal; Notable for the following components:  Result Value   MCV 102.7 (*)    MCH 35.4 (*)    Platelets 96 (*)    All other components within normal limits  COMPREHENSIVE METABOLIC PANEL - Abnormal; Notable for the following components:   Sodium 131 (*)    CO2 21 (*)    Glucose, Bld 125 (*)    BUN 6 (*)    Calcium 8.4 (*)    Albumin 3.1 (*)    AST 46 (*)    Total Bilirubin 2.3 (*)    All other components within normal limits    EKG None  Radiology DG ABD ACUTE 2+V W 1V CHEST  Result Date: 08/24/2021 CLINICAL DATA:  Shortness of breath for 2 months. EXAM: DG ABDOMEN ACUTE WITH 1 VIEW CHEST COMPARISON:  Chest radiograph on 03/07/2021 FINDINGS: There is no evidence of dilated bowel loops or free intraperitoneal air. No radiopaque calculi or other significant radiographic abnormality is seen. Heart size and mediastinal contours  are within normal limits. Both lungs are clear. IMPRESSION: Normal bowel gas pattern. No active cardiopulmonary disease. Electronically Signed   By: Marlaine Hind M.D.   On: 08/24/2021 10:38   VAS [place] LOWER EXTREMITY VENOUS (DVT) (ONLY [facility] & [facility])  Result Date: 08/24/2021  Lower Venous DVT Study Patient Name:  Don Norman  Date of Exam:   08/24/2021 Medical Rec #: [id number]            Accession #:    [id number] Date of Birth: 1945-05-13           Patient Gender: M Patient Age:   72 years Exam Location:  [facility] Procedure:      VAS [place] LOWER EXTREMITY VENOUS (DVT) Referring Phys: Broadus John Aldora Perman --------------------------------------------------------------------------------  Indications: Edema.  Limitations: Poor ultrasound/tissue interface. Comparison Study: No prior study Performing Technologist: Maudry Mayhew MHA, RDMS, RVT, RDCS  Examination Guidelines: A complete evaluation includes B-mode imaging, spectral Doppler, color Doppler, and power Doppler as needed of all accessible portions of each vessel. Bilateral testing is considered an integral part of a complete examination. Limited examinations for reoccurring indications may be performed as noted. The reflux portion of the exam is performed with the patient in reverse Trendelenburg.  +---------+---------------+---------+-----------+----------+--------------+ RIGHT    CompressibilityPhasicitySpontaneityPropertiesThrombus Aging +---------+---------------+---------+-----------+----------+--------------+ CFV      Full           Yes      Yes                                 +---------+---------------+---------+-----------+----------+--------------+ SFJ      Full                                                        +---------+---------------+---------+-----------+----------+--------------+ FV Prox  Full                                                         +---------+---------------+---------+-----------+----------+--------------+ FV Mid   Full                                                        +---------+---------------+---------+-----------+----------+--------------+  FV DistalFull                                                        +---------+---------------+---------+-----------+----------+--------------+ PFV      Full                                                        +---------+---------------+---------+-----------+----------+--------------+ POP      Full           Yes      Yes                                 +---------+---------------+---------+-----------+----------+--------------+ PTV      Full                                                        +---------+---------------+---------+-----------+----------+--------------+ PERO     Full                                                        +---------+---------------+---------+-----------+----------+--------------+   +----+---------------+---------+-----------+----------+--------------+ LEFTCompressibilityPhasicitySpontaneityPropertiesThrombus Aging +----+---------------+---------+-----------+----------+--------------+ CFV Full           Yes      Yes                                 +----+---------------+---------+-----------+----------+--------------+     Summary: RIGHT: - There is no evidence of deep vein thrombosis in the lower extremity.  - No cystic structure found in the popliteal fossa.  LEFT: - No evidence of common femoral vein obstruction.  *See table(s) above for measurements and observations.    Preliminary     Procedures Procedures    Medications Ordered in ED Medications - No data to display  ED Course/ Medical Decision Making/ A&P                           Medical Decision Making Amount and/or Complexity of Data Reviewed Labs: ordered. Radiology: ordered. ECG/medicine tests: ordered.  Risk Prescription drug  management.  This patient presents to the ED for concern of right leg swelling, this involves an extensive number of treatment options, and is a complaint that carries with it a high risk of complications and morbidity.  The differential diagnosis includes DVT or peripheral edema   Co morbidities that complicate the patient evaluation  EtOH abuse   Additional history obtained:  Additional history obtained from patient External records from outside source obtained and reviewed including hospital record   Lab Tests:  I Ordered, and personally interpreted labs.  The pertinent results include: Chemistries show hyponatremia at 131 and glucose 125   Imaging Studies ordered:  I ordered imaging studies including  DVT study of right lower I independently visualized and interpreted imaging which showed no I agree with the radiologist interpretation   Cardiac Monitoring: / EKG:  The patient was maintained on a cardiac monitor.  I personally viewed and interpreted the cardiac monitored which showed an underlying rhythm of: Normal sinus rhythm   Consultations Obtained:  No consult   Problem List / ED Course / Critical interventions / Medication management  Right leg swelling and pain I ordered medication including Lasix for Reevaluation of the patient after these medicines showed that the patient stayed the same I have reviewed the patients home medicines and have made adjustments as needed   Social Determinants of Health:  EtOH abuse   Test / Admission - Considered:  Patient does not need any other test and does not meet inpatient criteria  Patient with peripheral edema and negative DVT he is placed on Lasix and will follow-up with his md        Final Clinical Impression(s) / ED Diagnoses Final diagnoses:  Peripheral edema    Rx / DC Orders ED Discharge Orders          Ordered    furosemide (LASIX) 20 MG tablet  Daily        08/24/21 1234               Milton Ferguson, MD 08/27/21 1111

## 2022-07-05 ENCOUNTER — Emergency Department (HOSPITAL_COMMUNITY): Payer: No Typology Code available for payment source

## 2022-07-05 ENCOUNTER — Emergency Department (HOSPITAL_COMMUNITY)
Admission: EM | Admit: 2022-07-05 | Discharge: 2022-07-05 | Disposition: A | Discharge status: Home / Self Care | Payer: No Typology Code available for payment source | Attending: Emergency Medicine | Admitting: Emergency Medicine

## 2022-07-05 ENCOUNTER — Encounter (HOSPITAL_COMMUNITY): Payer: Self-pay

## 2022-07-05 DIAGNOSIS — Y92410 Unspecified street and highway as the place of occurrence of the external cause: Secondary | ICD-10-CM | POA: Diagnosis not present

## 2022-07-05 DIAGNOSIS — K429 Umbilical hernia without obstruction or gangrene: Secondary | ICD-10-CM | POA: Diagnosis not present

## 2022-07-05 DIAGNOSIS — M5481 Occipital neuralgia: Secondary | ICD-10-CM | POA: Diagnosis not present

## 2022-07-05 DIAGNOSIS — M542 Cervicalgia: Secondary | ICD-10-CM | POA: Insufficient documentation

## 2022-07-05 DIAGNOSIS — N62 Hypertrophy of breast: Secondary | ICD-10-CM | POA: Diagnosis not present

## 2022-07-05 DIAGNOSIS — M25562 Pain in left knee: Secondary | ICD-10-CM | POA: Insufficient documentation

## 2022-07-05 DIAGNOSIS — M25561 Pain in right knee: Secondary | ICD-10-CM | POA: Diagnosis not present

## 2022-07-05 DIAGNOSIS — Z6829 Body mass index (BMI) 29.0-29.9, adult: Secondary | ICD-10-CM | POA: Diagnosis not present

## 2022-07-05 DIAGNOSIS — E669 Obesity, unspecified: Secondary | ICD-10-CM | POA: Diagnosis not present

## 2022-07-05 DIAGNOSIS — I10 Essential (primary) hypertension: Secondary | ICD-10-CM | POA: Diagnosis not present

## 2022-07-05 LAB — I-STAT CHEM 8, ED
BUN: 4 mg/dL — ABNORMAL LOW (ref 8–23)
Calcium, Ion: 1.12 mmol/L — ABNORMAL LOW (ref 1.15–1.40)
Chloride: 102 mmol/L (ref 98–111)
Creatinine, Ser: 0.6 mg/dL — ABNORMAL LOW (ref 0.61–1.24)
Glucose, Bld: 132 mg/dL — ABNORMAL HIGH (ref 70–99)
HCT: 47 % (ref 39.0–52.0)
Hemoglobin: 16 g/dL (ref 13.0–17.0)
Potassium: 3.9 mmol/L (ref 3.5–5.1)
Sodium: 136 mmol/L (ref 135–145)
TCO2: 26 mmol/L (ref 22–32)

## 2022-07-05 MED ORDER — CYCLOBENZAPRINE HCL 10 MG PO TABS: 10.0000 mg | ORAL_TABLET | Freq: Two times a day (BID) | ORAL | 0 refills | Status: AC | PRN

## 2022-07-05 MED ORDER — OXYCODONE-ACETAMINOPHEN 5-325 MG PO TABS
2.0000 | ORAL_TABLET | Freq: Once | ORAL | Status: AC
  Administered 2022-07-05: 2 via ORAL
  Filled 2022-07-05: qty 2

## 2022-07-05 MED ORDER — ACETAMINOPHEN 500 MG PO TABS: 500.0000 mg | ORAL_TABLET | Freq: Four times a day (QID) | ORAL | 0 refills | Status: AC | PRN

## 2022-07-05 MED ORDER — IOHEXOL 300 MG/ML  SOLN
75.0000 mL | Freq: Once | INTRAMUSCULAR | Status: AC | PRN
  Administered 2022-07-05: 75 mL via INTRAVENOUS
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
## 2023-09-08 ENCOUNTER — Encounter (HOSPITAL_COMMUNITY): Payer: Self-pay

## 2023-09-08 ENCOUNTER — Other Ambulatory Visit: Payer: Self-pay

## 2023-09-08 ENCOUNTER — Emergency Department (HOSPITAL_COMMUNITY)

## 2023-09-08 ENCOUNTER — Emergency Department (HOSPITAL_COMMUNITY)
Admission: EM | Admit: 2023-09-08 | Discharge: 2023-09-08 | Disposition: A | Discharge status: Home / Self Care | Attending: Emergency Medicine | Admitting: Emergency Medicine

## 2023-09-08 DIAGNOSIS — R600 Localized edema: Secondary | ICD-10-CM | POA: Insufficient documentation

## 2023-09-08 DIAGNOSIS — Z79899 Other long term (current) drug therapy: Secondary | ICD-10-CM | POA: Diagnosis not present

## 2023-09-08 DIAGNOSIS — I10 Essential (primary) hypertension: Secondary | ICD-10-CM | POA: Diagnosis not present

## 2023-09-08 DIAGNOSIS — E876 Hypokalemia: Secondary | ICD-10-CM | POA: Insufficient documentation

## 2023-09-08 DIAGNOSIS — R2243 Localized swelling, mass and lump, lower limb, bilateral: Secondary | ICD-10-CM | POA: Diagnosis present

## 2023-09-08 LAB — CBC WITH DIFFERENTIAL/PLATELET
Abs Immature Granulocytes: 0.02 10*3/uL (ref 0.00–0.07)
Basophils Absolute: 0 10*3/uL (ref 0.0–0.1)
Basophils Relative: 1 %
Eosinophils Absolute: 0 10*3/uL (ref 0.0–0.5)
Eosinophils Relative: 1 %
HCT: 42 % (ref 39.0–52.0)
Hemoglobin: 14.2 g/dL (ref 13.0–17.0)
Immature Granulocytes: 0 %
Lymphocytes Relative: 42 %
Lymphs Abs: 2.5 10*3/uL (ref 0.7–4.0)
MCH: 36.3 pg — ABNORMAL HIGH (ref 26.0–34.0)
MCHC: 33.8 g/dL (ref 30.0–36.0)
MCV: 107.4 fL — ABNORMAL HIGH (ref 80.0–100.0)
Monocytes Absolute: 0.7 10*3/uL (ref 0.1–1.0)
Monocytes Relative: 11 %
Neutro Abs: 2.7 10*3/uL (ref 1.7–7.7)
Neutrophils Relative %: 45 %
Platelets: 105 10*3/uL — ABNORMAL LOW (ref 150–400)
RBC: 3.91 MIL/uL — ABNORMAL LOW (ref 4.22–5.81)
RDW: 14 % (ref 11.5–15.5)
WBC: 5.9 10*3/uL (ref 4.0–10.5)
nRBC: 0 % (ref 0.0–0.2)

## 2023-09-08 LAB — BASIC METABOLIC PANEL WITH GFR
Anion gap: 10 (ref 5–15)
BUN: <5 mg/dL — ABNORMAL LOW (ref 8–23)
CO2: 19 mmol/L — ABNORMAL LOW (ref 22–32)
Calcium: 8.2 mg/dL — ABNORMAL LOW (ref 8.9–10.3)
Chloride: 104 mmol/L (ref 98–111)
Creatinine, Ser: 0.75 mg/dL (ref 0.61–1.24)
GFR, Estimated: >60 mL/min (ref >60)
Glucose, Bld: 77 mg/dL (ref 70–99)
Potassium: 3.4 mmol/L — ABNORMAL LOW (ref 3.5–5.1)
Sodium: 133 mmol/L — ABNORMAL LOW (ref 135–145)

## 2023-09-08 LAB — BRAIN NATRIURETIC PEPTIDE: B Natriuretic Peptide: 54.1 pg/mL (ref 0.0–100.0)

## 2023-09-08 MED ORDER — POTASSIUM CHLORIDE CRYS ER 20 MEQ PO TBCR: 20.0000 meq | EXTENDED_RELEASE_TABLET | Freq: Two times a day (BID) | ORAL | 0 refills | Status: AC

## 2023-09-08 MED ORDER — FUROSEMIDE 20 MG PO TABS
40.0000 mg | ORAL_TABLET | Freq: Once | ORAL | Status: AC
  Administered 2023-09-08: 40 mg via ORAL
  Filled 2023-09-08: qty 2

## 2023-09-08 MED ORDER — FUROSEMIDE 10 MG/ML IJ SOLN: 40.0000 mg | Freq: Once | INTRAMUSCULAR | Status: DC

## 2023-09-08 NOTE — ED Notes (Signed)
 This RN unable to establish IV access for medication administration. IV team consult ordered to obtain access, patient resting in bed.

## 2023-09-08 NOTE — ED Notes (Signed)
 Patient discharged in stable condition, education materials explained including, follow up, any prescriptions and reasons to return. Patient voiced agreement to education and discharge material.

## 2023-09-08 NOTE — ED Triage Notes (Signed)
 Pt c/o bilateral lower leg swelling for past few months. Pt states last night his right foot had some splits in it and started bleeding.

## 2023-09-08 NOTE — ED Notes (Signed)
 Awaiting patient placement in bed

## 2023-09-08 NOTE — ED Provider Triage Note (Signed)
 Emergency Medicine Provider Triage Evaluation Note  Fredric Slabach , a 78 y.o. male  was evaluated in triage.  Pt complains of peripheral edema.  Review of Systems  Positive: leg swelling, toe wound Negative: Chest pain  Physical Exam  BP 112/64 (BP Location: Left Arm)   Pulse 88   Temp 97.8 F (36.6 C)   Resp 19   SpO2 98%  Gen:   Awake, no distress   Resp:  Normal effort  MSK:   Moves extremities without difficulty  Other:  Lungs CTA  Medical Decision Making  Medically screening exam initiated at 1:34 PM.  Appropriate orders placed.  Audelia Leaks was informed that the remainder of the evaluation will be completed by another provider, this initial triage assessment does not replace that evaluation, and the importance of remaining in the ED until their evaluation is complete.  Patient on 20 mg Lasix  daily presents with progressively worsening LE edema x weeks. In the shower this morning and felt it was worse today. No fever, SOB, chest pain. He also reports right great toe was swollen and burst open today, bloody.    Mandy Second, PA-C 09/08/23 1337

## 2023-09-08 NOTE — ED Provider Notes (Signed)
 [place] EMERGENCY DEPARTMENT AT [facility] Provider Note   CSN: [id number] Arrival date & time: 09/08/23  1206     Patient presents with: Leg Swelling   Don Norman is a 78 y.o. male with past medical history of HTN, cardiomyopathy, GERD, alcohol abuse presents to Emergency Department for evaluation of bilateral lower leg swelling for the past few months and sore on right toe.  He reports that he is taking his Lasix  once a day.  [facility]  dose however he did not do this.  He denies chest pain, shortness of breath  He said he noticed blood last night when showering coming from his right foot.  No known trauma, fevers, swelling.   HPI     Prior to Admission medications   Medication Sig Start Date End Date Taking? Authorizing Provider  potassium chloride SA (KLOR-CON M) 20 MEQ tablet Take 1 tablet (20 mEq total) by mouth 2 (two) times daily for 3 days. 09/08/23 09/11/23 Yes Royann Cords, PA  acetaminophen  (TYLENOL ) 500 MG tablet Take 1 tablet (500 mg total) by mouth every 6 (six) hours as needed. 07/05/22   Debbra Fairy, PA-C  cyclobenzaprine  (FLEXERIL ) 10 MG tablet Take 1 tablet (10 mg total) by mouth 2 (two) times daily as needed for muscle spasms. 07/05/22   Debbra Fairy, PA-C  furosemide  (LASIX ) 20 MG tablet Take 1 tablet (20 mg total) by mouth daily. 08/24/21   Cheyenne Cotta, MD    Allergies: Zestril [lisinopril]    Review of Systems  Constitutional:  Negative for chills, fatigue and fever.  Respiratory:  Negative for cough, chest tightness, shortness of breath and wheezing.   Cardiovascular:  Negative for chest pain and palpitations.  Gastrointestinal:  Negative for abdominal pain, constipation, diarrhea, nausea and vomiting.  Neurological:  Negative for dizziness, seizures, weakness, light-headedness, numbness and headaches.    Updated Vital Signs BP (!) 145/82   Pulse 73   Temp 98.2 F (36.8 C) (Oral)    Resp 16   Ht 6' (1.829 m)   Wt 99.8 kg   SpO2 97%   BMI 29.84 kg/m   Physical Exam Vitals and nursing note reviewed.  Constitutional:      General: He is not in acute distress.    Appearance: Normal appearance.  HENT:     Head: Normocephalic and atraumatic.   Eyes:     Conjunctiva/sclera: Conjunctivae normal.    Cardiovascular:     Rate and Rhythm: Normal rate.     Pulses:          Dorsalis pedis pulses are 1+ on the right side and 1+ on the left side.  Pulmonary:     Effort: Pulmonary effort is normal. No respiratory distress.   Musculoskeletal:     Right lower leg: 2+ Pitting Edema present.     Left lower leg: 2+ Pitting Edema present.       Feet:   Skin:    Coloration: Skin is not jaundiced or pale.   Neurological:     Mental Status: He is alert. Mental status is at baseline.     (all labs ordered are listed, but only abnormal results are displayed) Labs Reviewed  CBC WITH DIFFERENTIAL/PLATELET - Abnormal; Notable for the following components:      Result Value   RBC 3.91 (*)    MCV 107.4 (*)    MCH 36.3 (*)    Platelets 105 (*)    All  other components within normal limits  BASIC METABOLIC PANEL WITH GFR - Abnormal; Notable for the following components:   Sodium 133 (*)    Potassium 3.4 (*)    CO2 19 (*)    BUN <5 (*)    Calcium 8.2 (*)    All other components within normal limits  BRAIN NATRIURETIC PEPTIDE    EKG: EKG Interpretation Date/Time:  Tuesday September 08 2023 13:35:32 EDT Ventricular Rate:  78 PR Interval:  172 QRS Duration:  134 QT Interval:  468 QTC Calculation: 533 R Axis:   135  Text Interpretation: Normal sinus rhythm Right axis deviation Non-specific intra-ventricular conduction block T wave abnormality Confirmed by Dorenda Gandy [phone]) on 09/08/2023 2:43:14 PM  Radiology: DG Chest 2 View Result Date: 09/08/2023 CLINICAL DATA:  Bilateral lower leg swelling. EXAM: CHEST - 2 VIEW COMPARISON:  08/24/2021 FINDINGS: Heart and  mediastinal contours within normal limits. Increased markings at the left lung base could reflect scarring or atelectasis. Right lung clear. No effusions. No acute bony abnormality. IMPRESSION: Increased markings at the left base, favor scarring or atelectasis. No active disease. Electronically Signed   By: Janeece Mechanic M.D.   On: 09/08/2023 15:29     Medications Ordered in the ED  furosemide  (LASIX ) tablet 40 mg (40 mg Oral Given 09/08/23 2233)                                    Medical Decision Making Risk Prescription drug management.   Patient presents to the ED for concern of pedal edema, wound on right toe, this involves an extensive number of treatment options, and is a complaint that carries with it a high risk of complications and morbidity.  The differential diagnosis includes acute on chronic CHF, fluid overload, PE, DVT, cellulitis, abscess, laceration   Co morbidities that complicate the patient evaluation  See HPI   Additional history obtained:  Additional history obtained from  Nursing   External records from outside source obtained and reviewed including triage note   Lab Tests:  I Ordered, and personally interpreted labs.  The pertinent results include:   Sodium 133 Potassium 3.4 Platelets 105 (baseline 96-1 19 over past 7 years) BNP 54     Cardiac Monitoring:  The patient was maintained on a cardiac monitor.  I personally viewed and interpreted the cardiac monitored which showed an underlying rhythm of: NSR similar to previous EKG from 2023   Medicines ordered and prescription drug management:  I ordered medication including lasix , potassium  for no edema, mild hypokalemia Reevaluation of the patient after these medicines showed that the patient stayed the same I have reviewed the patients home medicines and have made adjustments as needed     Problem List / ED Course:  Pedal edema BNP 54.  Chest x-ray without effusion.  No complaints of  chest pain or shortness of breath. No hypoxia Pedal edema 2+ pitting Taking Lasix  daily I offered to provide IV dose of Lasix  here in emergency department however patient does not wish to pursue this.  We will provide an additional dose here in emergency department as well as have him take 2 doses a day for the next 3 days.  Have also provided him with potassium supplementation to ensure that he does not become hypokalemic [facility] next week So strict return precautions to include chest pain, shortness  of breath Wound on right great toe. Noninfectious appearing.  0.5 cm wound on bottom of great right toe that appears that it was a blister that popped.  No cellulitis, erythema, streaking up the leg Discussed keeping wound clean and dry Extremity is well-perfused   Reevaluation:  After the interventions noted above, I reevaluated the patient and found that they have :stayed the same   Social Determinants of Health:  Has PCP at [place]   Dispostion:  After consideration of the diagnostic results and the patients response to treatment, I feel that the patent would benefit from outpatient management.  Discussed ED workup, disposition, return to ED precautions with patient who expresses understanding agrees with plan.  All questions answered to their satisfaction.  They are agreeable to plan.  Discharge instructions provided on paperwork  Final diagnoses:  Bilateral lower extremity edema    ED Discharge Orders          Ordered    potassium chloride SA (KLOR-CON M) 20 MEQ tablet  2 times daily        09/08/23 2209               Royann Cords, PA 09/08/23 2333    Tegeler, Marine Sia, MD 09/08/23 [phone]

## 2023-09-08 NOTE — Discharge Instructions (Addendum)
 Thank you for let us  evaluate you today.  You do have a blister under your first great toe on your right leg.  Please keep this clean and dry.  It may remain open when you are at home but make sure to cover it and clean dressing when out or if you are concerned that it may get dirty  Please take 2 pills of your Lasix  medication for the next 2 days and follow-up with primary care doctor later this week, early next week. Following next two days return to normal one pill of lasix  dailyy. I have also sent a couple potassium pills to your pharmacy top use next couple days while you have increased lasix   Return to emergency department for experience shortness of breath, chest pain

## 2023-12-22 ENCOUNTER — Encounter: Payer: Self-pay | Admitting: Gastroenterology

## 2024-01-29 ENCOUNTER — Encounter

## 2024-01-29 ENCOUNTER — Telehealth: Payer: Self-pay

## 2024-01-29 NOTE — Telephone Encounter (Signed)
 Understood, thank you.  Ellwood Dense MD

## 2024-01-29 NOTE — Telephone Encounter (Signed)
 Dr. Legrand,  Just sending as a FYI,   Pt decided to cancel his colonoscopy due to his age he wishes not to proceed with further testing

## 2024-02-11 ENCOUNTER — Encounter: Admitting: Gastroenterology
